# Patient Record
Sex: Male | Born: 1970 | Race: White | Hispanic: No | Marital: Married | Smoking: Former smoker
Health system: Southern US, Community
[De-identification: ages and names within clinical notes are randomized; demographics above are authoritative.]

## PROBLEM LIST (undated history)

## (undated) ENCOUNTER — Emergency Department (HOSPITAL_COMMUNITY): Discharge status: Against Medical Advice | Payer: Self-pay

## (undated) DIAGNOSIS — F419 Anxiety disorder, unspecified: Secondary | ICD-10-CM

## (undated) DIAGNOSIS — G4700 Insomnia, unspecified: Secondary | ICD-10-CM

## (undated) DIAGNOSIS — L4050 Arthropathic psoriasis, unspecified: Secondary | ICD-10-CM

## (undated) DIAGNOSIS — K219 Gastro-esophageal reflux disease without esophagitis: Secondary | ICD-10-CM

## (undated) DIAGNOSIS — F32A Depression, unspecified: Secondary | ICD-10-CM

## (undated) DIAGNOSIS — L409 Psoriasis, unspecified: Secondary | ICD-10-CM

## (undated) HISTORY — PX: BONE MARROW BIOPSY: SHX199

## (undated) HISTORY — DX: Depression, unspecified: F32.A

## (undated) HISTORY — DX: Psoriasis, unspecified: L40.9

## (undated) HISTORY — PX: ANAL FISSURE REPAIR: SHX2312

## (undated) HISTORY — DX: Arthropathic psoriasis, unspecified: L40.50

## (undated) HISTORY — DX: Anxiety disorder, unspecified: F41.9

## (undated) HISTORY — DX: Gastro-esophageal reflux disease without esophagitis: K21.9

## (undated) HISTORY — DX: Insomnia, unspecified: G47.00

## (undated) HISTORY — PX: LYMPH NODE BIOPSY: SHX201

## (undated) HISTORY — PX: CATARACT EXTRACTION, BILATERAL: SHX1313

## (undated) HISTORY — PX: CYST EXCISION: SHX5701

---

## 2003-01-16 ENCOUNTER — Emergency Department (HOSPITAL_COMMUNITY): Admission: EM | Admit: 2003-01-16 | Discharge: 2003-01-16 | Payer: Self-pay | Admitting: Emergency Medicine

## 2003-01-28 ENCOUNTER — Ambulatory Visit (HOSPITAL_COMMUNITY): Admission: RE | Admit: 2003-01-28 | Discharge: 2003-01-28 | Payer: Self-pay | Admitting: Hematology & Oncology

## 2003-01-29 ENCOUNTER — Ambulatory Visit (HOSPITAL_COMMUNITY): Admission: RE | Admit: 2003-01-29 | Discharge: 2003-01-29 | Payer: Self-pay | Admitting: Hematology & Oncology

## 2003-02-13 ENCOUNTER — Encounter: Admission: RE | Admit: 2003-02-13 | Discharge: 2003-02-13 | Payer: Self-pay | Admitting: Infectious Diseases

## 2003-03-12 ENCOUNTER — Encounter: Admission: RE | Admit: 2003-03-12 | Discharge: 2003-03-12 | Payer: Self-pay | Admitting: Infectious Diseases

## 2003-03-25 ENCOUNTER — Ambulatory Visit (HOSPITAL_COMMUNITY): Admission: RE | Admit: 2003-03-25 | Discharge: 2003-03-25 | Payer: Self-pay

## 2004-07-21 ENCOUNTER — Ambulatory Visit: Payer: Self-pay | Admitting: Family Medicine

## 2004-07-21 ENCOUNTER — Ambulatory Visit: Payer: Self-pay | Admitting: Hematology & Oncology

## 2004-07-26 ENCOUNTER — Ambulatory Visit (HOSPITAL_COMMUNITY): Admission: RE | Admit: 2004-07-26 | Discharge: 2004-07-26 | Payer: Self-pay | Admitting: Hematology & Oncology

## 2004-09-21 ENCOUNTER — Ambulatory Visit: Payer: Self-pay | Admitting: Hematology & Oncology

## 2004-11-09 ENCOUNTER — Ambulatory Visit: Payer: Self-pay | Admitting: Hematology & Oncology

## 2005-01-04 ENCOUNTER — Ambulatory Visit: Payer: Self-pay | Admitting: Hematology & Oncology

## 2005-04-06 ENCOUNTER — Ambulatory Visit: Payer: Self-pay | Admitting: Hematology & Oncology

## 2005-07-14 ENCOUNTER — Ambulatory Visit: Payer: Self-pay | Admitting: Hematology & Oncology

## 2005-07-18 LAB — CBC & DIFF AND RETIC
BASO%: 0.3 % (ref 0.0–2.0)
Basophils Absolute: 0 10*3/uL (ref 0.0–0.1)
EOS%: 1.3 % (ref 0.0–7.0)
Eosinophils Absolute: 0 10*3/uL (ref 0.0–0.5)
HCT: 36.3 % — ABNORMAL LOW (ref 38.7–49.9)
HGB: 12.2 g/dL — ABNORMAL LOW (ref 13.0–17.1)
IRF: 0.24 (ref 0.070–0.380)
LYMPH%: 22.2 % (ref 14.0–48.0)
MCH: 28.3 pg (ref 28.0–33.4)
MCHC: 33.7 g/dL (ref 32.0–35.9)
MCV: 84.1 fL (ref 81.6–98.0)
MONO#: 0.2 10*3/uL (ref 0.1–0.9)
MONO%: 7.6 % (ref 0.0–13.0)
NEUT#: 2.1 10*3/uL (ref 1.5–6.5)
NEUT%: 68.6 % (ref 40.0–75.0)
Platelets: 176 10*3/uL (ref 145–400)
RBC: 4.31 10*6/uL (ref 4.20–5.71)
RDW: 15.2 % — ABNORMAL HIGH (ref 11.2–14.6)
RETIC #: 38.4 10*3/uL (ref 31.8–103.9)
Retic %: 0.9 % (ref 0.7–2.3)
WBC: 3 10*3/uL — ABNORMAL LOW (ref 4.0–10.0)
lymph#: 0.7 10*3/uL — ABNORMAL LOW (ref 0.9–3.3)

## 2005-07-18 LAB — CHCC SMEAR

## 2005-11-11 ENCOUNTER — Ambulatory Visit: Payer: Self-pay | Admitting: Hematology & Oncology

## 2006-06-20 IMAGING — CT CT ABDOMEN W/ CM
1 of 4 series · 13 of 32 positions shown, 18 images · IV contrast (omnipaque)
Comparison: 01/29/03.
CHEST:

CLINICAL DATA: Anemia, adenopathy.
CT SCAN OF THE CHEST, ABDOMEN, AND PELVIS WITH CONTRAST ? 07/26/04 AT 6566 HOURS:
TECHNIQUE: 150 cc of Omnipaque 300.

[Series 2: cap 5.0 b40s st · axial · 0.79mm/px · z∈[+806,+1406]mm · 13 of 138 slices shown, 18 images]
[im 9/138  soft-tissue]
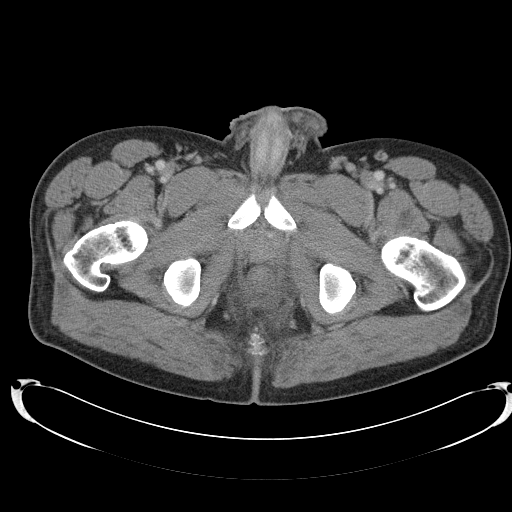
[im 9/138  bone]
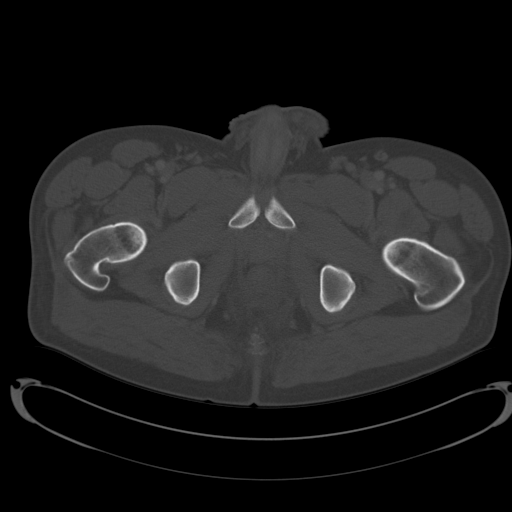
[im 25/138  soft-tissue]
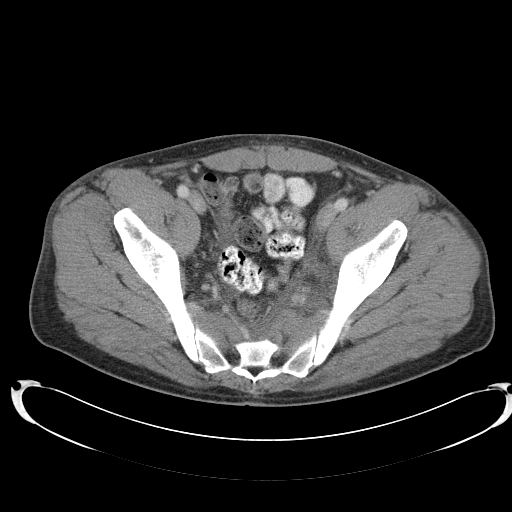
[im 33/138  soft-tissue]
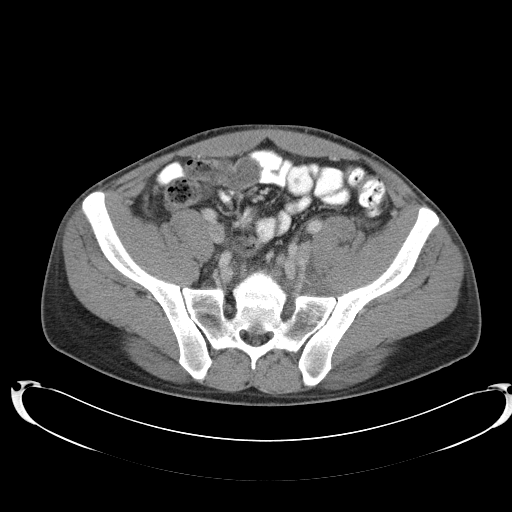
[im 41/138  soft-tissue]
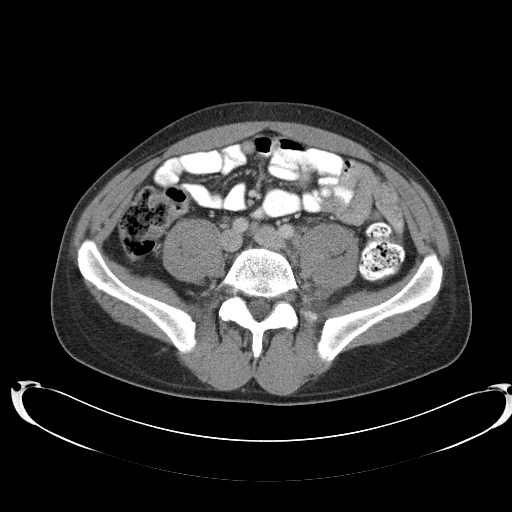
[im 57/138  soft-tissue]
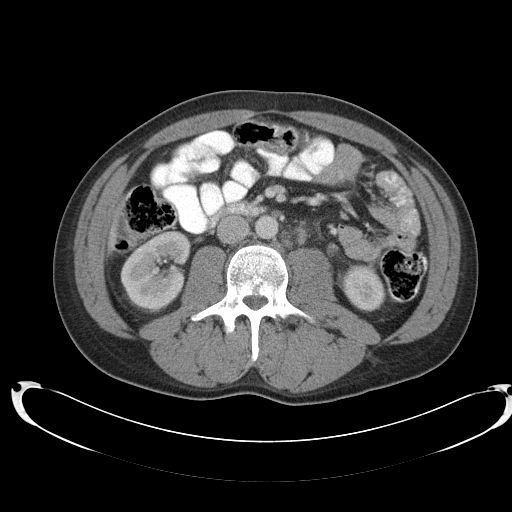
[im 65/138  soft-tissue]
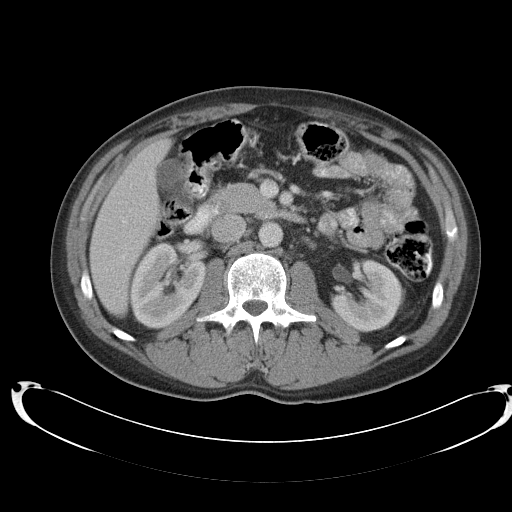
[im 73/138  soft-tissue]
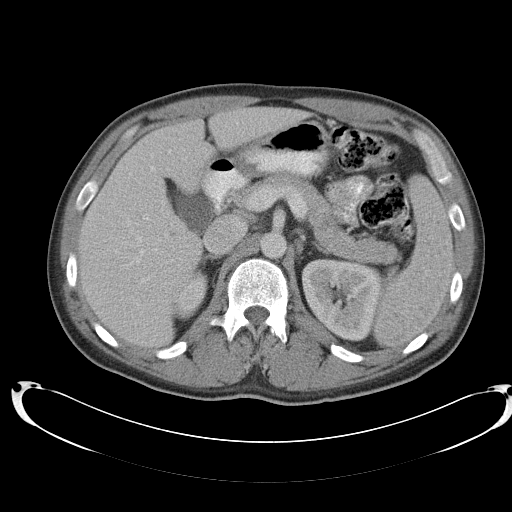
[im 89/138  soft-tissue]
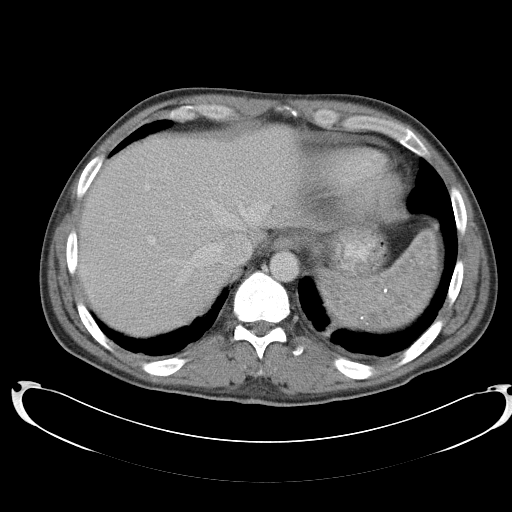
[im 97/138  soft-tissue]
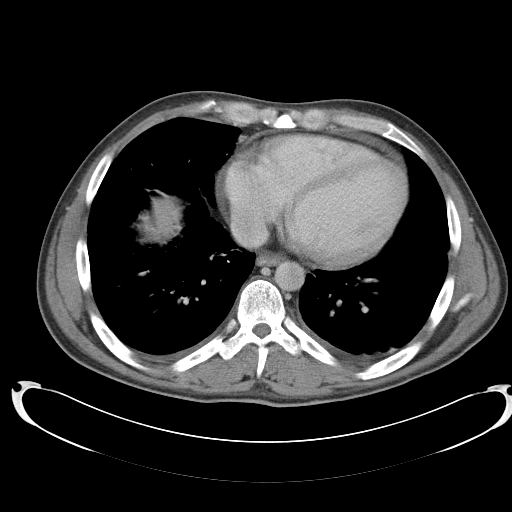
[im 97/138  bone]
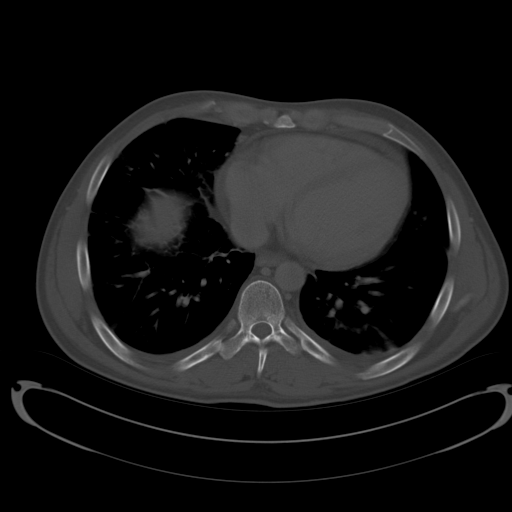
[im 105/138  soft-tissue]
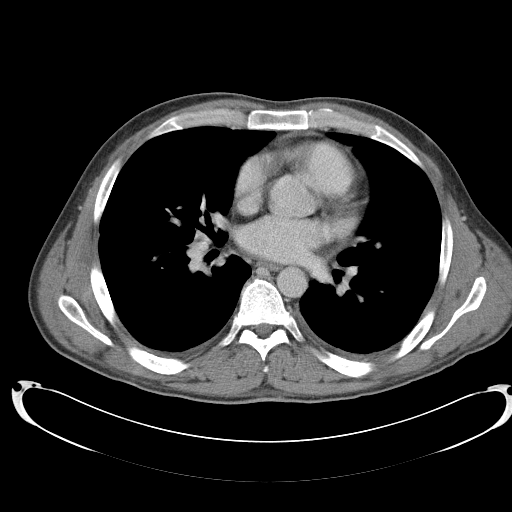
[im 105/138  lung]
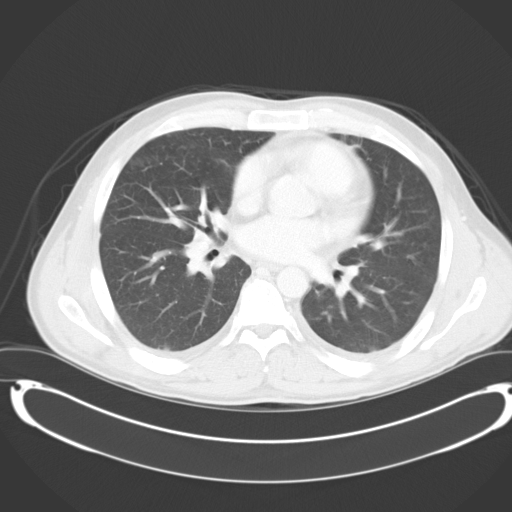
[im 113/138  lung]
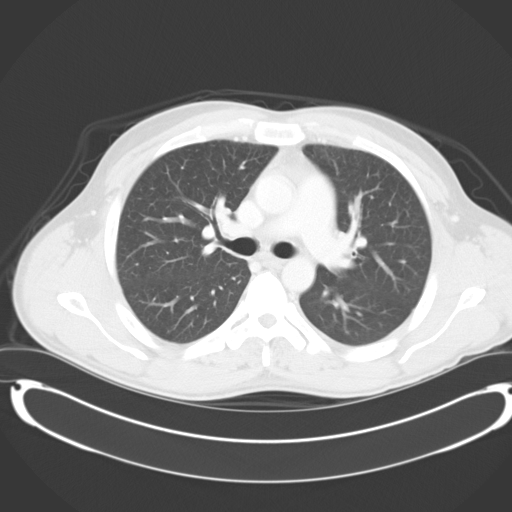
[im 121/138  soft-tissue]
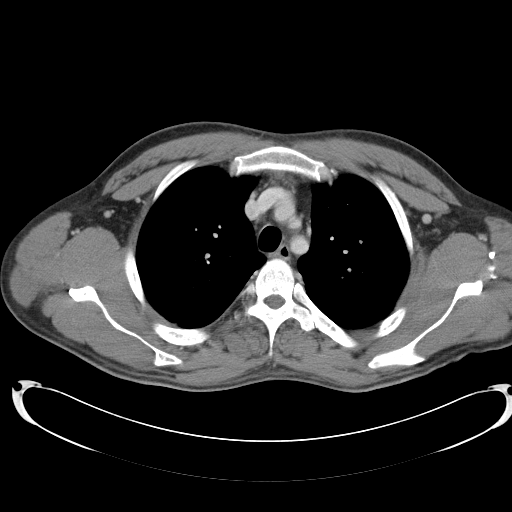
[im 121/138  lung]
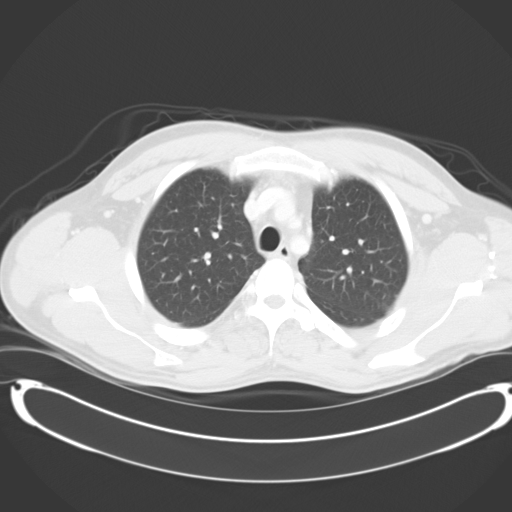
[im 129/138  soft-tissue]
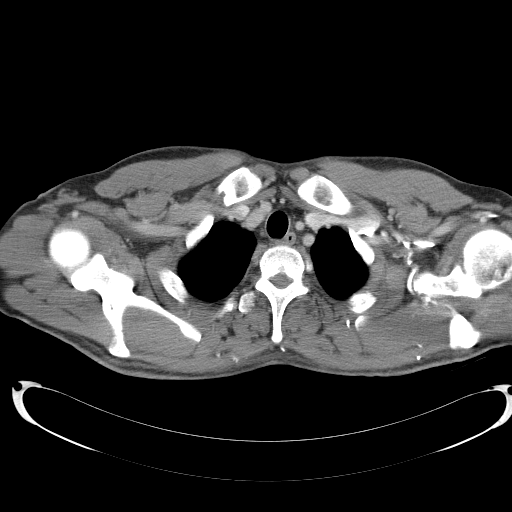
[im 129/138  lung]
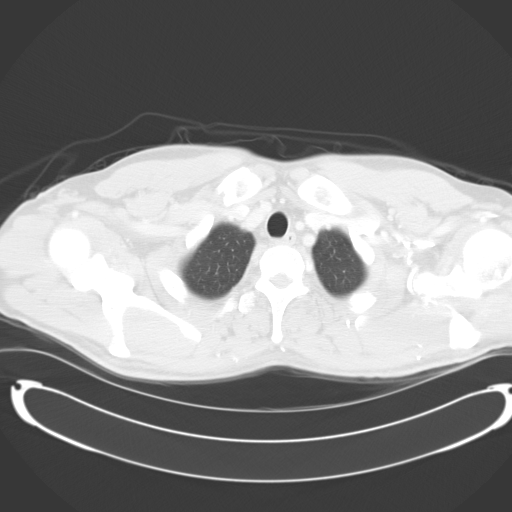

[13 of 32 positions shown; findings below may reference images not displayed]

FINDINGS: Small hilar and mediastinal nodes are seen.  Negative abnormal adenopathy by CT measurements criteria.  A tiny pericardial effusion is noted.  Tiny bilateral pleural effusions are present.  Nodular pleural thickening no longer appreciated.  There are however, multifocal areas of patchy atelectasis throughout the periphery of both lungs as well as the lung bases.  No focal masses are seen.
IMPRESSION: 1.  Tiny pericardial effusion and tiny bilateral pleural effusions.
2.  Mild mediastinal adenopathy and pleural nodules are no longer appreciated.
3.  Bilateral patchy atelectasis.
ABDOMEN:
FINDINGS: Multifocal low density lesions throughout the spleen with calcifications are again seen.  Adenopathy in the splenic hilum has markedly improved.  Persistently enlarged nodes are still noted.  On image #64, there is a 1.2 cm short-axis diameter lymph node.  Other smaller nodes are seen.  
Para-aortic adenopathy is mild and unchanged.  Small mesenteric nodes are less prominent.  
The liver, gallbladder, and adrenal glands are within normal limits.  Renal calculi are again noted.  The pancreas is within normal limits without focal mass effect.  Stranding in the retroperitoneum at the region of the left para-aortic adenopathy is present compatible with infiltration of the fat.  It is unchanged.
IMPRESSION: 1.  Adenopathy in the splenic hilum has markedly improved.  Mesenteric adenopathy is better.  Retroperitoneal adenopathy is not significantly changed.  This supports a benign etiology such as an inflammatory process.
2.  Splenic lesions with calcifications would support a granulomatous etiology such as fungal infection.
PELVIS:
FINDINGS: Stranding in the retroperitoneum involving the left side of the pelvis and perirectal region is more prominent.  The patient does have a history of recent anal fistula surgery.  Small retroperitoneal nodes are seen.  No suspicious bone lesions are present.  The bladder is within normal limits.  There is a 2.6 x 1.7 cm low density lesion in the left iliopsoas muscle on image #127 that is not significantly changed.  There is no focal abscess cavity.  There is no linear soft tissue density extending from the rectum through the ischiorectal fossa to suggest persistent fistula.
IMPRESSION: 1.  Stranding about the perirectal fat and left side of the pelvis is worse however, the patient has had recent surgery in the anal region. Consider postoperative changes and/or inflammation.
2.  Low density lesion in the left iliopsoas muscle not changed over the course of eighteen months.  This would support benign etiology.

## 2016-07-20 ENCOUNTER — Encounter: Payer: Self-pay | Admitting: Sports Medicine

## 2016-07-20 ENCOUNTER — Ambulatory Visit (INDEPENDENT_AMBULATORY_CARE_PROVIDER_SITE_OTHER): Payer: BLUE CROSS/BLUE SHIELD | Admitting: Sports Medicine

## 2016-07-20 DIAGNOSIS — B351 Tinea unguium: Secondary | ICD-10-CM

## 2016-07-20 DIAGNOSIS — L600 Ingrowing nail: Secondary | ICD-10-CM | POA: Diagnosis not present

## 2016-07-20 DIAGNOSIS — M79675 Pain in left toe(s): Secondary | ICD-10-CM | POA: Diagnosis not present

## 2016-07-20 NOTE — Progress Notes (Signed)
Patient ID: Aaron Fowler, male   DOB: 04/17/1970, 46 y.o.   MRN: [id number]

## 2016-07-20 NOTE — Patient Instructions (Signed)

## 2016-07-20 NOTE — Progress Notes (Signed)
Subjective: Aaron Fowler is a 46 y.o. male patient presents to office today complaining of a painful incurvated, red, hot, swollen left 1st toenail at lateral margin. This has been present for sometime now is on Keflex as given by pcp. Patient has treated this by self trimming. Patient denies fever/chills/nausea/vomitting/any other related constitutional symptoms at this time.  There are no active problems to display for this patient.   No current outpatient prescriptions on file prior to visit.   No current facility-administered medications on file prior to visit.     Allergies  Allergen Reactions  . Sulfamethoxazole Rash    Other reaction(s): Fever (intolerance)    Objective:  There were no vitals filed for this visit.  General: Well developed, nourished, in no acute distress, alert and oriented x3   Dermatology: Skin is warm, dry and supple bilateral. Left hallux nail appears to be  severely incurvated with hyperkeratosis formation at the distal aspects of  the lateral nail border. (-) Erythema. (+) Edema. (-) serosanguous  drainage present. The nail is mycotic as well. The remaining nails appear unremarkable at this time. There are psoratic skin changes bilateral.There are no open sores, lesions or other signs of infection  present.  Vascular: Dorsalis Pedis artery and Posterior Tibial artery pedal pulses are 2/4 bilateral with immedate capillary fill time. Pedal hair growth present. No lower extremity edema.   Neruologic: Grossly intact via light touch bilateral.  Musculoskeletal: Tenderness to palpation of the left hallux lateral>medial nail fold(s). Muscular strength within normal limits in all groups bilateral.   Assesement and Plan: Problem List Items Addressed This Visit    None    Visit Diagnoses    Ingrown nail    -  Primary   Dermatophytosis of nail       Toe pain, left          -Discussed treatment alternatives and plan of care; Explained  permanent/temporary nail avulsion and post procedure course to patient. Patient opt for total permanent removal of left hallux nail  - After a verbal consent, injected 3 ml of a 50:50 mixture of 2% plain  lidocaine and 0.5% plain marcaine in a normal hallux block fashion. Next, a  betadine prep was performed. Anesthesia was tested and found to be appropriate.  The left hallux nail was then incised from the hyponychium to the epinychium. The offending nail border was removed and cleared from the field. The area was curretted for any remaining nail or spicules. Phenol application performed and the area was then flushed with alcohol and dressed with antibiotic cream and a dry sterile dressing. -Patient was instructed to leave the dressing intact for today and begin soaking  in a weak solution of betadine or Epsom salt and water tomorrow. Patient was instructed to  soak for 15 minutes each day and apply neosporin and a gauze or bandaid dressing each day. -Patient was instructed to monitor the toe for signs of infection and return to office if toe becomes red, hot or swollen. -Advised ice, elevation, and tylenol or motrin if needed for pain.  -Work note given to return Monday 07-25-16 -Patient is to return in 2 weeks for follow up care/nail check or sooner if problems arise.  Asencion Islamitorya Kysha Muralles, DPM

## 2016-07-20 NOTE — Progress Notes (Signed)
   Subjective:    Patient ID: Aaron MooreJames D Mims, male    DOB: 12/06/1970, 46 y.o.   MRN: [id number]  HPI   I have an ingrown on my left big toe and has been going on for about 2 years and does hurt to touch and went to the doctor last week and gave an antibiotic and I have dug on it and does get sore and tender and I am not a diabetic    Review of Systems  All other systems reviewed and are negative.      Objective:   Physical Exam        Assessment & Plan:

## 2016-08-04 ENCOUNTER — Ambulatory Visit (INDEPENDENT_AMBULATORY_CARE_PROVIDER_SITE_OTHER): Payer: BLUE CROSS/BLUE SHIELD | Admitting: Sports Medicine

## 2016-08-04 DIAGNOSIS — Z9889 Other specified postprocedural states: Secondary | ICD-10-CM

## 2016-08-04 DIAGNOSIS — M79675 Pain in left toe(s): Secondary | ICD-10-CM

## 2016-08-04 NOTE — Progress Notes (Signed)
Subjective: Aaron Fowler is a 46 y.o. male patient returns to office today for follow up evaluation after having Left Hallux total permanent nail avulsion performed on 07-20-16. Patient has been soaking using epsom salt and applying topical antibiotic covered with bandaid daily. Patient deniesfever/chills/nausea/vomitting/any other related constitutional symptoms at this time.  There are no active problems to display for this patient.   Current Outpatient Prescriptions on File Prior to Visit  Medication Sig Dispense Refill  . predniSONE (DELTASONE) 20 MG tablet Take 15 mg by mouth.    . zolpidem (AMBIEN) 10 MG tablet Take 10 mg by mouth.     No current facility-administered medications on file prior to visit.     Allergies  Allergen Reactions  . Sulfamethoxazole Rash    Other reaction(s): Fever (intolerance)    Objective:  General: Well developed, nourished, in no acute distress, alert and oriented x3   Dermatology: Skin is warm, dry and supple bilateral. Left hallux nail bed appears to be clean, dry, with mild granular tissue and surrounding eschar/scab. (-) Erythema. (-) Edema. (-) serosanguous drainage present. The remaining nails appear unremarkable at this time. There are no other lesions or other signs of infection  present.  Neurovascular status: Intact. No lower extremity swelling; No pain with calf compression bilateral.  Musculoskeletal: Decreased tenderness to palpation of the Left hallux. Muscular strength within normal limits bilateral.   Assesement and Plan: Problem List Items Addressed This Visit    None    Visit Diagnoses    S/P nail surgery    -  Primary   Toe pain, left          -Examined patient  -Cleansed left hallux nail bed and gently scrubbed with peroxide and q-tip/curetted away eschar at site and applied antibiotic cream covered with bandaid.  -Discussed plan of care with patient. -Patient to continue soaking in a weak solution of Epsom salt and  warm water. Patient was instructed to soak for 15-20 minutes each day until the toe appears normal and there is no drainage, redness, tenderness, or swelling at the procedure site, and apply neosporin and a gauze or bandaid dressing each day as needed. May leave open to air at night. -Educated patient on long term care after nail surgery. -Patient was instructed to monitor the toe for reoccurrence and signs of infection; Patient advised to return to office or go to ER if toe becomes red, hot or swollen. -Patient is to return as needed or sooner if problems arise.  Aaron Fowler, DPM

## 2016-10-04 DIAGNOSIS — N179 Acute kidney failure, unspecified: Secondary | ICD-10-CM | POA: Diagnosis not present

## 2016-10-04 DIAGNOSIS — R109 Unspecified abdominal pain: Secondary | ICD-10-CM | POA: Diagnosis not present

## 2016-10-05 DIAGNOSIS — R109 Unspecified abdominal pain: Secondary | ICD-10-CM | POA: Diagnosis not present

## 2016-10-05 DIAGNOSIS — N179 Acute kidney failure, unspecified: Secondary | ICD-10-CM | POA: Diagnosis not present

## 2016-10-06 DIAGNOSIS — N179 Acute kidney failure, unspecified: Secondary | ICD-10-CM | POA: Diagnosis not present

## 2016-10-06 DIAGNOSIS — R109 Unspecified abdominal pain: Secondary | ICD-10-CM | POA: Diagnosis not present

## 2016-10-06 LAB — HM COLONOSCOPY

## 2016-10-07 DIAGNOSIS — N179 Acute kidney failure, unspecified: Secondary | ICD-10-CM | POA: Diagnosis not present

## 2016-10-07 DIAGNOSIS — R109 Unspecified abdominal pain: Secondary | ICD-10-CM | POA: Diagnosis not present

## 2019-12-23 NOTE — Progress Notes (Signed)
Office Visit Note  Patient: Aaron Fowler             Date of Birth: February 08, 1971           MRN: [id number]             PCP: Charlynn Court, NP Referring: Charlynn Court, NP Visit Date: 12/24/2019   Subjective:   History of Present Illness: Aaron Fowler is a 49 y.o. male referred here for skin rash and joint pains.  He has a history of psoriasis since teenage years and has been treated with a variety of medications including topical treatments, glucocorticoids, methotrexate, and TNF inhibitors.  He took methotrexate for about 5 years and some improvement was reported in skin rash and joint pains.  Records reviewed state concerned about liver unclear if there was actual laboratory/LFTs changes or not.  Treated with Enbrel x1 year discontinued after case of fungal stomatitis.  He was treated with Humira 2001-2012 reported little benefit for skin and joint.  Work-up with cancer center here and 2004-2005 due to lymphadenopathy and splenomegaly that was nonrevealing for any malignancy.  He has been treated with daily glucocorticoids for many years and for the past 10 years was on about 15 mg daily dose.  Due to his extensive weight gain, edema, and skin bruising he wanted to taper this so went down from 15 mg to 0 over the past 6 months at 2.5 mg/month increments.  Since coming off completely about a month or 2 ago he is experiencing increased diffuse myalgias and arthralgias, lower extremity muscle weakness, fatigue, and episodes of dizziness.  He has been successful losing about 60 pounds during this time.  He started taking diclofenac for the joint pain with improvement.  He recently had surgery for bilateral cataracts.  He denies any significant recent infections in the past year.    Activities of Daily Living:  Patient reports morning stiffness for all  Day. Patient Reports nocturnal pain.  Difficulty dressing/grooming: Reports Difficulty climbing stairs: Reports Difficulty getting out of  chair: Reports Difficulty using hands for taps, buttons, cutlery, and/or writing: Denies  Review of Systems  Constitutional: Positive for fatigue.  HENT: Positive for mouth sores and nose dryness. Negative for mouth dryness.   Eyes: Positive for pain, itching and dryness.  Respiratory: Positive for shortness of breath and difficulty breathing.   Cardiovascular: Negative for chest pain and palpitations.  Gastrointestinal: Positive for diarrhea. Negative for blood in stool and constipation.  Endocrine: Positive for increased urination.  Genitourinary: Positive for urgency. Negative for difficulty urinating.  Musculoskeletal: Positive for arthralgias, joint pain, joint swelling, myalgias, morning stiffness, muscle tenderness and myalgias.  Skin: Positive for rash. Negative for color change.  Allergic/Immunologic: Negative for susceptible to infections.  Neurological: Positive for dizziness, numbness, headaches and weakness. Negative for memory loss.  Hematological: Positive for bruising/bleeding tendency.  Psychiatric/Behavioral: Negative for confusion.    PMFS History:  Patient Active Problem List   Diagnosis Date Noted  . Other psoriasis 12/24/2019  . Long term current use of systemic steroids 12/24/2019  . Polyarthralgia 12/24/2019    Past Medical History:  Diagnosis Date  . Psoriasis    per patient     Family History  Problem Relation Age of Onset  . Heart Problems Mother   . Prostate cancer Father   . Autoimmune disease Sister    Past Surgical History:  Procedure Laterality Date  . ANAL FISSURE REPAIR    . BONE MARROW  BIOPSY    . CATARACT EXTRACTION, BILATERAL     10/28/2019, 12/16/2019   . CYST EXCISION     on buttock   . LYMPH NODE BIOPSY     Social History   Social History Narrative  . Not on file    There is no immunization history on file for this patient.   Objective: Vital Signs: BP 99/66 (BP Location: Right Arm, Patient Position: Sitting, Cuff Size:  Normal)   Pulse 86   Resp 15   Ht $R'6\' 1"'gU$  (1.854 m)   Wt 196 lb 3.2 oz (89 kg)   BMI 25.89 kg/m    Physical Exam HENT:     Right Ear: External ear normal.     Left Ear: External ear normal.     Mouth/Throat:     Mouth: Mucous membranes are moist.     Pharynx: Oropharynx is clear.     Comments: Full upper and lower dentures Eyes:     Conjunctiva/sclera: Conjunctivae normal.     Pupils: Pupils are equal, round, and reactive to light.  Cardiovascular:     Rate and Rhythm: Normal rate and regular rhythm.  Pulmonary:     Effort: Pulmonary effort is normal.     Breath sounds: Normal breath sounds.  Skin:    Comments: Scattered erythematous plaques are present on bilateral elbows, palms of hands, right knee Abdominal striae present Skin thinning and dryness present noticeable on hands and feet Probable dermatophytosis of nails and previous we removed left great toenail partially regrown  Neurological:     General: No focal deficit present.      Musculoskeletal Exam:  Neck full range of motion no tenderness Left shoulder pain with abduction and extension no swelling or changes seen, right shoulder normal range of motion  Elbow, wrist, fingers full range of motion mild tenderness but no swelling or erythema Knees full range of motion no tenderness or swelling Bilateral ankles with some soft tissue swelling no clear effusion, no swelling or nodules of Achilles tendon though mildly tender to palpation  CDAI Exam: CDAI Score: -- Patient Global: --; Provider Global: -- Swollen: --; Tender: -- Joint Exam 12/24/2019   No joint exam has been documented for this visit   There is currently no information documented on the homunculus. Go to the Rheumatology activity and complete the homunculus joint exam.  Investigation: No additional findings.  Imaging: No results found.  Recent Labs: Lab Results  Component Value Date   WBC 3.0 (L) 07/18/2005   HGB 12.2 (L) 07/18/2005   PLT  176 07/18/2005    Speciality Comments: No specialty comments available.  Procedures:  No procedures performed Allergies: Sulfamethoxazole   Assessment / Plan:     Visit Diagnoses: Other psoriasis  Skin rash is currently only mildly active with mostly less than 1 cm scattered lesions.  It does appear to be a plaque type lesions over both elbows and the right knee.  Not clear if the small lesions on his palms are related to the removal of steroid therapy versus other active psoriatic disease.  For now expect were needing to restart glucocorticoids for his adrenal insufficiency and will then need to decide if steroid sparing DMARDs are also needed.  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  He reports at least 10 years of 15 mg or greater daily dose.  He was doing quite well while tapering off steroids but has developed numerous additional symptoms after coming off completely which would be consistent with now being below a replacement dose.  He reports diffuse pain, muscle weakness without inflammation, episodes of orthostatic dizziness, as well as fatigue and extremely low energy.  We will go ahead and check inflammatory markers as well as a cortisol and ACTH level not ideal but 9 AM labs while off therapy may be helpful in characterizing this.  Recommend he restart at 5 mg daily dose and follow-up in 2 weeks to see if there is improvement in symptoms.  Polyarthralgia  Having diffuse joint pains currently no obvious synovitis or joint effusions.  There is soft tissue swelling over the ankles but no achilles enthesitis visible on ultrasound.  This could be from active arthritis or could be from glucocorticoid withdrawal we will check inflammatory markers.  Both causes likely to be improved by restarting low-dose  prednisone.  Orders: Orders Placed This Encounter  Procedures  . ACTH  . Cortisol  . Sedimentation rate  . C-reactive protein   Meds ordered this encounter  Medications  . predniSONE (DELTASONE) 2.5 MG tablet    Sig: Take 2 tablets (5 mg total) by mouth daily with breakfast.    Follow-Up Instructions: Return in about 2 weeks (around 01/07/2020).   Collier Salina, MD  Note - This record has been created using [employer].  Chart creation errors have been sought, but may not always  have been located. Such creation errors do not reflect on  the standard of medical care.

## 2019-12-24 ENCOUNTER — Ambulatory Visit (INDEPENDENT_AMBULATORY_CARE_PROVIDER_SITE_OTHER): Payer: BC Managed Care – PPO | Admitting: Internal Medicine

## 2019-12-24 ENCOUNTER — Other Ambulatory Visit: Payer: Self-pay

## 2019-12-24 ENCOUNTER — Encounter: Payer: Self-pay | Admitting: Internal Medicine

## 2019-12-24 DIAGNOSIS — Z7952 Long term (current) use of systemic steroids: Secondary | ICD-10-CM | POA: Diagnosis not present

## 2019-12-24 DIAGNOSIS — L408 Other psoriasis: Secondary | ICD-10-CM

## 2019-12-24 DIAGNOSIS — M2550 Pain in unspecified joint: Secondary | ICD-10-CM

## 2019-12-24 HISTORY — DX: Pain in unspecified joint: M25.50

## 2019-12-24 HISTORY — DX: Long term (current) use of systemic steroids: Z79.52

## 2019-12-24 HISTORY — DX: Other psoriasis: L40.8

## 2019-12-24 MED ORDER — PREDNISONE 2.5 MG PO TABS: 5.0000 mg | ORAL_TABLET | Freq: Every day | ORAL | Status: DC

## 2019-12-26 LAB — SEDIMENTATION RATE: Sed Rate: 19 mm/h — ABNORMAL HIGH (ref 0–15)

## 2019-12-26 LAB — CORTISOL: Cortisol, Plasma: 2.4 ug/dL — ABNORMAL LOW

## 2019-12-26 LAB — C-REACTIVE PROTEIN: CRP: 12.7 mg/L — ABNORMAL HIGH (ref <8.0)

## 2019-12-26 LAB — ACTH: C206 ACTH: 21 pg/mL (ref 6–50)

## 2019-12-30 NOTE — Progress Notes (Signed)
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 He also has increased inflammation markers that could be from his psoriasis. Recommend continuing prednisone for now and we can reassess at follow up.

## 2020-01-06 NOTE — Progress Notes (Signed)
Office Visit Note  Patient: Aaron Fowler             Date of Birth: 05/15/1970           MRN: [id number]             PCP: Charlynn Court, NP Referring: Charlynn Court, NP Visit Date: 01/07/2020  Subjective:   History of Present Illness: Aaron Fowler is a 49 y.o. male with a very long history of repeated evaluations for psoriasis but also felt to have other poorly characterized autoimmune disorder and and joint inflammation with a long history of glucocorticoid treatment who recently restarted prednisone at 45m daily for suspected adrenal insufficiency based on symptoms of orthostatic dizziness, hypotension, diffuse arthralgias, severe fatigue.  Since resuming prednisone he has not noticed a very significant benefit in symptoms.  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  He has not noticed any particular change in skin rashes.   12/24/19 HPI reviewed He has a history of psoriasis since teenage years and has been treated with a variety of medications including topical treatments, glucocorticoids, methotrexate, and TNF inhibitors.  He took methotrexate for about 5 years and some improvement was reported in skin rash and joint pains.  Records reviewed state concerned about liver unclear if there was actual laboratory/LFTs changes or not.  Treated with Enbrel x1 year discontinued after case of fungal stomatitis.  He was treated with Humira 2001-2012 reported little benefit for skin and joint.  Work-up with cancer center here and 2004-2005 due to lymphadenopathy and splenomegaly that was nonrevealing for any malignancy.  He has been treated with daily glucocorticoids for many years and for the past 10 years was on about 15 mg daily dose.  Due to his extensive weight gain, edema, and skin bruising he wanted to taper this so went down from 15 mg to 0 over the past 6 months at 2.5  mg/month increments.  Since coming off completely about a month or 2 ago he is experiencing increased diffuse myalgias and arthralgias, lower extremity muscle weakness, fatigue, and episodes of dizziness.   Activities of Daily Living:  Patient reports morning stiffness for 30 minutes.   Patient Reports nocturnal pain.  Difficulty dressing/grooming: Denies Difficulty climbing stairs: Denies Difficulty getting out of chair: Reports Difficulty using hands for taps, buttons, cutlery, and/or writing: Denies  Review of Systems  Constitutional: Negative for fatigue.  HENT: Negative for mouth sores, mouth dryness and nose dryness.   Eyes: Positive for pain and dryness. Negative for itching and visual disturbance.  Respiratory: Negative for cough, hemoptysis, shortness of breath and difficulty breathing.   Cardiovascular: Negative for chest pain, palpitations and swelling in legs/feet.  Gastrointestinal: Negative for abdominal pain, blood in stool, constipation and diarrhea.  Endocrine: Negative for increased urination.  Genitourinary: Negative for painful urination.  Musculoskeletal: Positive for arthralgias, joint pain, joint swelling, myalgias, muscle weakness, morning stiffness, muscle tenderness and myalgias.  Skin: Negative for color change, rash and redness.  Allergic/Immunologic: Negative for susceptible to infections.  Neurological: Positive for dizziness, numbness and headaches. Negative for memory loss and weakness.  Hematological: Negative for swollen glands.  Psychiatric/Behavioral: Negative for confusion and sleep disturbance.    PMFS History:  Patient Active Problem List   Diagnosis Date Noted   Other psoriasis 12/24/2019   Long term current use of systemic steroids 12/24/2019   Polyarthralgia  12/24/2019    Past Medical History:  Diagnosis Date   Psoriasis    per patient     Family History  Problem Relation Age of Onset   Heart Problems Mother    Prostate cancer  Father    Autoimmune disease Sister    Past Surgical History:  Procedure Laterality Date   ANAL FISSURE REPAIR     BONE MARROW BIOPSY     CATARACT EXTRACTION, BILATERAL     10/28/2019, 12/16/2019    CYST EXCISION     on buttock    LYMPH NODE BIOPSY     Social History   Social History Narrative   Not on file    There is no immunization history on file for this patient.   Objective: Vital Signs: BP (!) 88/53 (BP Location: Right Arm, Patient Position: Sitting, Cuff Size: Small)    Pulse 77    Ht 6' 1"  (1.854 m)    Wt 188 lb (85.3 kg)    BMI 24.80 kg/m    Physical Exam HENT:     Right Ear: External ear normal.     Left Ear: External ear normal.  Skin:    General: Skin is warm and dry.     Comments: There are few scab over erythematous spots on the dorsum of the right knuckles and palms of both hands Skin thinning and dryness present on extremities Skin on his trunk and proximal arms with diffuse piloerection/gooseflesh no erythematous rashes Abdominal striae present  Neurological:     Mental Status: He is alert.     Musculoskeletal Exam:  Elbow, wrist, fingers full range of motion no tenderness or swelling Probable soft tissue swelling over lateral ankles bilaterally without erythema or warmth, ROM preserved  CDAI Exam: CDAI Score: -- Patient Global: --; Provider Global: -- Swollen: --; Tender: -- Joint Exam 01/07/2020   No joint exam has been documented for this visit   There is currently no information documented on the homunculus. Go to the Rheumatology activity and complete the homunculus joint exam.  Investigation: No additional findings.  Imaging: No results found.  Recent Labs: Lab Results  Component Value Date   WBC 3.0 (L) 07/18/2005   HGB 12.2 (L) 07/18/2005   PLT 176 07/18/2005    Speciality Comments: No specialty comments available.  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  This is not typically a finding of inflammatory joint disease.  He has not had very significant improvement on low-dose prednisone is possible that the replacement dose is still too low.  There is some bilateral ankle swelling though not with change range of motion or warmth unclear if this represents enthesitis or arthritis.  I discussed did not see a very clear indication for specific steroid sparing DMARD treatment at this time so recommended increasing prednisone till he gets a clinical response initially. This would be expected to help symptoms whether related to an inflammatory arthritis or from adrenal insufficiency. He was frustrated with this recommendation as he has had many years of minimal symptom benefit despite using steroids without a clear diagnosis or more specific plan despite seeing multiple specialists [place] for [place]. He does plan to try increasing steroid dose back to 10 mg for his symptoms but says he will follow-up for this medication with  his primary doctors.  Long term current use of systemic steroids  Also discussed if symptoms are still related to suspected adrenal insufficiency could be video evaluated by endocrinology he was not interested in referral or a work-up for that at this time.  I do think with any transition to glucocorticoid sparing treatment or just reducing treatment will need to be managed very slowly considering his hypotension and orthostatic symptoms especially.  Other psoriasis  Do not see skin disease activity for psoriasis at this time.  Last visit he had small erythematous plaques on the elbows but his skin complaint now with the diffuse to bumpy skin texture not typical for psoriatic inflammation.  He has been tried previously on TNF inhibitors without great  response and has you steroids extensively.  Without inflammation more clearly attributable to active psoriasis I would not start a trial of alternative biologic DMARD such as IL-17 or IL-23 targeted treatments.  Orders: No orders of the defined types were placed in this encounter.  No orders of the defined types were placed in this encounter.   Follow-Up Instructions: No follow-ups on file.   Collier Salina, MD  Note - This record has been created using [employer].  Chart creation errors have been sought, but may not always  have been located. Such creation errors do not reflect on  the standard of medical care.

## 2020-01-07 ENCOUNTER — Ambulatory Visit: Payer: BC Managed Care – PPO | Admitting: Internal Medicine

## 2020-01-07 ENCOUNTER — Encounter: Payer: Self-pay | Admitting: Internal Medicine

## 2020-01-07 ENCOUNTER — Other Ambulatory Visit: Payer: Self-pay

## 2020-01-07 VITALS — BP 88/53 | HR 77 | Ht 73.0 in | Wt 188.0 lb

## 2020-01-07 DIAGNOSIS — M2550 Pain in unspecified joint: Secondary | ICD-10-CM | POA: Diagnosis not present

## 2020-01-07 DIAGNOSIS — L408 Other psoriasis: Secondary | ICD-10-CM

## 2020-01-07 DIAGNOSIS — Z7952 Long term (current) use of systemic steroids: Secondary | ICD-10-CM | POA: Diagnosis not present

## 2020-01-22 ENCOUNTER — Other Ambulatory Visit: Payer: Self-pay

## 2020-01-22 ENCOUNTER — Ambulatory Visit: Payer: BC Managed Care – PPO | Admitting: Sports Medicine

## 2020-01-22 ENCOUNTER — Encounter: Payer: Self-pay | Admitting: Sports Medicine

## 2020-01-22 ENCOUNTER — Other Ambulatory Visit: Payer: Self-pay | Admitting: *Deleted

## 2020-01-22 DIAGNOSIS — M79674 Pain in right toe(s): Secondary | ICD-10-CM

## 2020-01-22 DIAGNOSIS — L408 Other psoriasis: Secondary | ICD-10-CM | POA: Diagnosis not present

## 2020-01-22 DIAGNOSIS — L603 Nail dystrophy: Secondary | ICD-10-CM | POA: Diagnosis not present

## 2020-01-22 DIAGNOSIS — M2550 Pain in unspecified joint: Secondary | ICD-10-CM | POA: Diagnosis not present

## 2020-01-22 NOTE — Progress Notes (Signed)
Subjective: Aaron Fowler is a 49 y.o. male patient presents to office today complaining of a painful thickened right great toenail.  Patient reports that it hurts in his shoes and would like to discuss removal of this nail.  Patient denies significant redness warmth swelling drainage or any constitutional symptoms at this time.  No other pedal complaints noted.   Patient Active Problem List   Diagnosis Date Noted  . Other psoriasis 12/24/2019  . Long term current use of systemic steroids 12/24/2019  . Polyarthralgia 12/24/2019    Current Outpatient Medications on File Prior to Visit  Medication Sig Dispense Refill  . pantoprazole (PROTONIX) 40 MG tablet Take 40 mg by mouth daily.    . predniSONE (DELTASONE) 2.5 MG tablet Take 2 tablets (5 mg total) by mouth daily with breakfast.    . tiZANidine (ZANAFLEX) 4 MG tablet Take 4 mg by mouth 3 (three) times daily.    Marland Kitchen zolpidem (AMBIEN) 10 MG tablet Take 10 mg by mouth.     No current facility-administered medications on file prior to visit.    Allergies  Allergen Reactions  . Sulfamethoxazole Rash    Other reaction(s): Fever (intolerance)    Objective:  There were no vitals filed for this visit.  General: Well developed, nourished, in no acute distress, alert and oriented x3   Dermatology: Skin is warm, dry and supple bilateral.  Right hallux nail severely thickened and overgrown with subungual debris that is severe in nature. The remaining nails appear unremarkable at this time. There are no open sores, lesions or other signs of infection  present.  Vascular: Dorsalis Pedis artery and Posterior Tibial artery pedal pulses are 2/4 bilateral with immedate capillary fill time. Pedal hair growth present. No lower extremity edema.   Neruologic: Grossly intact via light touch bilateral.  Musculoskeletal: Tenderness to palpation of right hallux nail.  Patient is status post previous P&A procedure performed on the left hallux  nail.  Assesement and Plan: Problem List Items Addressed This Visit      Musculoskeletal and Integument   Other psoriasis     Other   Polyarthralgia    Other Visit Diagnoses    Toe pain, right    -  Primary   Onychodystrophy          -Discussed treatment alternatives and plan of care; Explained permanent/temporary nail avulsion and post procedure course to patient. Patient elects for permanent total nail avulsion right hallux - After a verbal and written consent, injected 3 ml of a 50:50 mixture of 2% plain  lidocaine and 0.5% plain marcaine in a normal hallux block fashion. Next, a  betadine prep was performed. Anesthesia was tested and found to be appropriate.  The offending right hallux nail completely was then incised from the hyponychium to the epinychium. The offending nail border was removed and cleared from the field. The area was curretted for any remaining nail or spicules. Phenol application performed and the area was then flushed with alcohol and dressed with antibiotic cream and a dry sterile dressing. -Patient was instructed to leave the dressing intact for today and begin soaking  in a weak solution of betadine or Epsom salt and water tomorrow. Patient was instructed to  soak for 15-20 minutes each day and apply neosporin/corticosporin and a gauze or bandaid dressing each day. -Patient was instructed to monitor the toe for signs of infection and return to office if toe becomes red, hot or swollen. -Advised ice, elevation, and tylenol or  motrin if needed for pain.  -Patient is to return in 2 to 3 weeks for follow up care/nail check or sooner if problems arise.  Asencion Islam, DPM

## 2020-02-12 ENCOUNTER — Ambulatory Visit: Payer: BC Managed Care – PPO | Admitting: Sports Medicine

## 2021-10-11 DIAGNOSIS — H353131 Nonexudative age-related macular degeneration, bilateral, early dry stage: Secondary | ICD-10-CM | POA: Diagnosis not present

## 2022-02-01 ENCOUNTER — Ambulatory Visit (INDEPENDENT_AMBULATORY_CARE_PROVIDER_SITE_OTHER): Payer: BC Managed Care – PPO

## 2022-02-01 ENCOUNTER — Ambulatory Visit: Payer: BC Managed Care – PPO | Admitting: Podiatry

## 2022-02-01 DIAGNOSIS — M25571 Pain in right ankle and joints of right foot: Secondary | ICD-10-CM

## 2022-02-01 DIAGNOSIS — M79672 Pain in left foot: Secondary | ICD-10-CM

## 2022-02-01 DIAGNOSIS — M79671 Pain in right foot: Secondary | ICD-10-CM

## 2022-02-01 DIAGNOSIS — M7752 Other enthesopathy of left foot: Secondary | ICD-10-CM

## 2022-02-01 NOTE — Progress Notes (Signed)
Subjective:  Patient ID: Aaron Fowler, male    DOB: [birth year],  MRN: [id number]  Chief Complaint  Patient presents with   Foot Pain    Pt states he is having sharp pain in his left heel , he states the pain stay around the heel area Aching Pain in the right heel that goes up to his ankle , pt denies any swelling     51 y.o. male presents with concern for pain in his left heel as well as pain in the right ankle.  He says the heel pain is worse than the right ankle however both are bothering him.  He does have a history of chronic steroid use for inflammatory pain.  He says that he has had this heel pain in the past however usually it goes away on its own but this time this continue to bother him and is getting worse.  He also has pain in his right ankle on the outside.  This is flared up recently as well seems to be getting worse.  Denies any acute injuries.  Does have dry cracked skin on both heels as well and is looking for recommendations for that as well.  Past Medical History:  Diagnosis Date   Psoriasis    per patient     Allergies  Allergen Reactions   Sulfamethoxazole Rash    Other reaction(s): Fever (intolerance)    ROS: Negative except as per HPI above  Objective:  General: AAO x3, NAD  Dermatological: Dry cracked skin with fissuring present on bilateral heel.  Vascular:  Dorsalis Pedis artery and Posterior Tibial artery pedal pulses are 2/4 bilateral.  Capillary fill time < 3 sec to all digits.   Neruologic: Grossly intact via light touch bilateral. Protective threshold intact to all sites bilateral.   Musculoskeletal: Pain with palpation of the plantar aspect of the left heel directly underlying the calcaneus at the site of plantar calcaneal bursa.  No significant pain at the plantar medial tubercle slightly anterior at the site of insertion of plantar fascia.  Plantar right ankle there is noted to be pain at the sinus tarsi with palpation.  Also pain at the course  of the peroneus longus and brevis tendons distally.  Gait: Unassisted, Nonantalgic.   No images are attached to the encounter.  Radiographs:  Date: 01/24/2022 XR both feet Weightbearing AP/Lateral/Oblique   Findings: No acute osseous abnormality identified no fracture identified.  On the left heel there is no significant osseous spurring plantarly or posteriorly.  Arthritic changes noted about the right ankle joint and subtalar joint.  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 Calcaneal bursitis, left   3. Pain in both feet      Plan:  Patient was evaluated and treated and all questions answered.  #Plantar calcaneal bursitis, left - XR reviewed as above.  - Educated on icing and stretching. Instructions given.  - Injection delivered to the plantar calcaneal bursa as below. - DME: Recommend use of gel inserts.  Patient also has inserts from the [employer] store.  Recommend wearing good supportive shoes with cushioning at all times. - Pharmacologic management: Meloxicam 15 mg daily. Educated on risks/benefits and proper taking of medication.  Patient already has a supply of meloxicam at home and will begin taking this.  Procedure: Injection plantar calcaneal bursa left Location: Left plantar calcaneal bursa at the glabrous junction; medial approach. Skin Prep: alcohol Injectate: 1 cc 0.5% marcaine plain, 1 cc  kenalog 10. Disposition: Patient tolerated procedure well. Injection site dressed with a band-aid.  #Sinus tarsi syndrome right ankle -Educated on icing and stretching -Recommend meloxicam as well for this issue -Injection delivered to the sinus tarsi right ankle as below  Procedure: Injection right ankle sinus tarsi Location: Right ankle sinus tarsi lateral approach Skin Prep: alcohol Injectate: 1 cc 0.5% marcaine plain, 1 cc kenalog 10. Disposition: Patient tolerated procedure well. Injection site dressed with a band-aid.  Return in about 6 weeks  (around 03/15/2022) for R Sinus tarsi syndrome, Left heel bursitis.          Corinna Gab, DPM [facility]

## 2022-02-01 NOTE — Patient Instructions (Signed)

## 2022-03-18 ENCOUNTER — Ambulatory Visit (INDEPENDENT_AMBULATORY_CARE_PROVIDER_SITE_OTHER): Payer: BC Managed Care – PPO | Admitting: Podiatry

## 2022-03-18 DIAGNOSIS — Z91199 Patient's noncompliance with other medical treatment and regimen due to unspecified reason: Secondary | ICD-10-CM

## 2022-03-18 NOTE — Progress Notes (Signed)
Pt was a no show for apt, charge generated 

## 2022-05-29 DIAGNOSIS — R9431 Abnormal electrocardiogram [ECG] [EKG]: Secondary | ICD-10-CM | POA: Diagnosis not present

## 2022-05-29 DIAGNOSIS — S0990XA Unspecified injury of head, initial encounter: Secondary | ICD-10-CM | POA: Diagnosis not present

## 2022-05-29 DIAGNOSIS — W19XXXA Unspecified fall, initial encounter: Secondary | ICD-10-CM | POA: Diagnosis not present

## 2022-05-29 DIAGNOSIS — S0101XA Laceration without foreign body of scalp, initial encounter: Secondary | ICD-10-CM | POA: Diagnosis not present

## 2022-05-29 DIAGNOSIS — Z23 Encounter for immunization: Secondary | ICD-10-CM | POA: Diagnosis not present

## 2022-05-29 DIAGNOSIS — R55 Syncope and collapse: Secondary | ICD-10-CM | POA: Diagnosis not present

## 2022-07-12 ENCOUNTER — Encounter (HOSPITAL_COMMUNITY): Payer: Self-pay | Admitting: Registered Nurse

## 2022-07-12 ENCOUNTER — Ambulatory Visit (HOSPITAL_COMMUNITY)
Admission: EM | Admit: 2022-07-12 | Discharge: 2022-07-12 | Disposition: A | Discharge status: Home / Self Care | Payer: BC Managed Care – PPO | Attending: Registered Nurse | Admitting: Registered Nurse

## 2022-07-12 DIAGNOSIS — F4323 Adjustment disorder with mixed anxiety and depressed mood: Secondary | ICD-10-CM

## 2022-07-12 HISTORY — DX: Adjustment disorder with mixed anxiety and depressed mood: F43.23

## 2022-07-12 NOTE — ED Provider Notes (Signed)
Behavioral Health Urgent Care Medical Screening Exam  Patient Name: Aaron Fowler MRN: [id number] Date of Evaluation: 07/12/22 Chief Complaint:   Diagnosis:  Final diagnoses:  Adjustment disorder with mixed anxiety and depressed mood    History of Present illness: Aaron Fowler is a 52 y.o. male patient presented to [facility] as a walk in accompanied by his wife with complaints of depression and worsening anxiety  [name], 52 y.o., male patient seen face to face by this provider, consulted with Dr. Nelly Rout, and chart reviewed on 07/12/22.  On evaluation Aaron Fowler reports he came in today related to "losing my mind.  I had a nervous breakdown yesterday.  I was at work and I think I had a panic attack.  I got the tremors, I couldn't function, then uncontrollable crying.  It really scared me."  Patient states he has had periods of depression for the last 2 years that seen to be getting worse.  "Sometimes I feel worthless, that I will never amount to nothing.  When ever people say stuff I take it as a personal attack even though I know it wasn't ment like that.  I've lost interest in things I like to do.  It's like I don't care about anything."  He states his primary care doctor started him on Trintellix about 3 weeks ago (1st wk 5 mg, 2nd wk 10 mg, now taking 20 mg daily).  "Yesterday my doctor gave me a prescription for Klonopin just to take the edge off."  Patient denies prior psychiatric history other than the periods of depression for past 2 yrs and current prescribed psychotropic medications.  He denies prior outpatient psychiatric services or psychiatric hospitalization.  He also denies self harming behaviors and prior suicide attempt.  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  He lives with his wife and 3 sons ages 33, 30, and 74.  States his family is very supportive "I've had a good life and I've got a good family.  That's why I don't understand why I'm feeling  like this."  Patient denies suicidal/self-harm/homicidal ideation, psychosis, and paranoia.  States he is looking for outpatient psychiatric services and would prefer face to face visits.   During evaluation Aaron Fowler is sitting up right in chair dressed appropriate for weather.  There is no noted distress.  He is alert/oriented x 4, calm, cooperative, attentive, and responses were relevant and appropriate to assessment questions.  He spoke in a clear tone at moderate volume, and normal pace, with good eye contact.   He denies suicidal/self-harm/homicidal ideation, psychosis, and paranoia.  Objectively:  there is no evidence of psychosis/mania or delusional thinking.  He conversed coherently, with goal directed thoughts, and no distractibility, or pre-occupation.  At this time Aaron Fowler is educated and verbalizes understanding of mental health resources and other crisis services in the community. He is instructed to call 911 and present to the nearest emergency room should he experience any suicidal/homicidal ideation, auditory/visual/hallucinations, or detrimental worsening of his mental health condition.  He was a also advised by Clinical research associate that he could call the toll-free phone on back of  insurance card to assist with identifying counselors and agencies in network.   Flowsheet Row ED from 07/12/2022 in [facility]  C-SSRS RISK CATEGORY No Risk       Psychiatric Specialty Exam  Presentation  General Appearance:Appropriate for Environment; Casual  Eye Contact:Good  Speech:Clear and  Coherent; Normal Rate  Speech Volume:Normal  Handedness:Right   Mood and Affect  Mood: Anxious; Depressed  Affect: Congruent   Thought Process  Thought Processes: Coherent; Goal Directed  Descriptions of Associations:Intact  Orientation:Full (Time, Place and Person)  Thought Content:Logical    Hallucinations:None  Ideas of Reference:None  Suicidal  Thoughts:No  Homicidal Thoughts:No   Sensorium  Memory: Immediate Good; Recent Good; Remote Good  Judgment: Intact  Insight: Present   Executive Functions  Concentration: Good  Attention Span: Good  Recall: Good  Fund of Knowledge: Good  Language: Good   Psychomotor Activity  Psychomotor Activity: Normal   Assets  Assets: Communication Skills; Desire for Improvement; Financial Resources/Insurance; Housing; Intimacy; Leisure Time; Physical Health; Resilience; Social Support; Transportation   Sleep  Sleep: Good  Number of hours: No data recorded  Physical Exam: Physical Exam Vitals and nursing note reviewed.  Constitutional:      General: He is not in acute distress.    Appearance: Normal appearance. He is not ill-appearing.  HENT:     Head: Normocephalic.  Eyes:     Conjunctiva/sclera: Conjunctivae normal.  Cardiovascular:     Rate and Rhythm: Normal rate.  Pulmonary:     Effort: Pulmonary effort is normal. No respiratory distress.  Musculoskeletal:        General: Normal range of motion.     Cervical back: Normal range of motion.  Skin:    General: Skin is warm and dry.  Neurological:     Mental Status: He is alert and oriented to person, place, and time.  Psychiatric:        Attention and Perception: Attention and perception normal. He does not perceive auditory or visual hallucinations.        Mood and Affect: Mood is anxious and depressed.        Speech: Speech normal.        Behavior: Behavior normal. Behavior is cooperative.        Thought Content: Thought content normal. Thought content is not paranoid or delusional. Thought content does not include homicidal or suicidal ideation.        Cognition and Memory: Cognition and memory normal.        Judgment: Judgment normal.    Review of Systems  Constitutional:        No other complaints voiced  Psychiatric/Behavioral:  Positive for depression. Hallucinations: Denies. Substance  abuse: Denies. Suicidal ideas: Denies.The patient is nervous/anxious. The patient does not have insomnia.   All other systems reviewed and are negative.  Blood pressure 112/86, pulse 100, temperature 98 F (36.7 C), temperature source Oral, resp. rate 18, SpO2 98 %. There is no height or weight on file to calculate BMI.  Musculoskeletal: Strength & Muscle Tone: within normal limits Gait & Station: normal Patient leans: N/A    [facility] MSE Discharge Disposition for Follow up and Recommendations: Based on my evaluation the patient does not appear to have an emergency medical condition and can be discharged with resources and follow up care in outpatient services for Medication Management and Individual Therapy  Thursday Jul 14, 2022 at 3:00 PM virtual appointment for therapy intake with  Frederic Jericho, MSW, LCSW, [facility] [address] [place], [place] [address] Office [phone] Fax [phone]  Assunta Found, NP 07/12/2022, 3:33 PM

## 2022-07-12 NOTE — Progress Notes (Signed)
   07/12/22 1440  [facility] Triage Screening (Walk-ins at [facility] only)  How Did You Hear About [place]? Self  What Is the Reason for Your Visit/Call Today? Pt presents to [facility] voluntarily seeking therapy and psychiatry resources due to increased depression and anxiety symptoms. Pt reports a panic attack yesterday; shaking, crying and feels like he was having a nervous break down. Pt states "I need to do something before my wife leaves me". Pt states he cannot identify a specific trigger but states he has not found joy in life for several years. Pt denies SI/HI and AVH.  How Long Has This Been Causing You Problems? > than 6 months  Have You Recently Had Any Thoughts About Hurting Yourself? No  Are You Planning to Commit Suicide/Harm Yourself At This time? No  Have you Recently Had Thoughts About Hurting Someone Karolee Ohs? No  Are You Planning To Harm Someone At This Time? No  Are you currently experiencing any auditory, visual or other hallucinations? No  Have You Used Any Alcohol or Drugs in the Past 24 Hours? No  Do you have any current medical co-morbidities that require immediate attention? No  Clinician description of patient physical appearance/behavior: casually dressed, depressed mood congruent affect  What Do You Feel Would Help You the Most Today? Treatment for Depression or other mood problem  If access to [facility] was not available, would you have sought care in the Emergency Department? No  Determination of Need Routine (7 days)  Options For Referral Outpatient Therapy;Medication Management

## 2022-07-12 NOTE — Discharge Instructions (Addendum)
You are scheduled an appointment for therapy intake on Jul 14, 2022 at 3:00 PM.  Instructions for virtual appointment will be sent to your email.    Frederic Jericho, MSW, [place], [facility] [phone] [place], [place] [address] Office [phone] Fax [phone]  Sometimes in life trying situations occur that may be difficult to handle alone. My goal is to help you successfully manage these challenges so that you can experience a more balanced and fulfilling life. I offer an eclectic approach to therapy, which includes solution-focused, reality-based, person-centered approaches. I will assist you in breaking the cycle of negative thinking and behaviors. If you are in need of support or guidance in order to make your life more manageable, I welcome the opportunity to assist you in doing so. My goal is to empower you to live a life of personal growth and positive well-being. Please call [phone] or email [email or web address] for an individual and family therapy or consultation today.      Based on the information you have provided and the presenting issue, outpatient services and resources for have been recommended.  It is imperative that you follow through with treatment recommendations within 5-7 days from the of discharge to mitigate further risk to your safety and mental well-being. A list of referrals has been provided below to get you started.  You are not limited to the list provided.  In case of an urgent crisis, you may contact the Mobile Crisis Unit with [facility] at [phone].  Writer met with the patient and was able to link the patient with Frederic Jericho, LCSW to have an appointment with her on Thursday Jul 14, 2022 at 3pm virtually   Frederic Jericho, MSW, [place], [facility] [address] [place], [place] [address] Office [phone] Fax [phone]   I am pleased that you have selected me as your therapist.  I have a Child psychotherapist of Social  Work degree, obtained at the [employer] [place] in 2000.  I am licensed with the [facility] Social Work Public librarian [phone]).   The services offered to you include individual, family, and group counseling.  My therapeutic approach  is derived from my training and experience in Cognitive-Behavioral Therapy, Solution Focused Therapy, Reality Therapy, as well as, interpersonal and developmental theories of counseling.  I have special interests in anxiety, depression, sexual and physical abuse, play therapy, child and adolescent development, and family systems.  I am specifically trained in Trauma Focused-Cognitive Behavioral Therapy (TF-CBT).  I will not discriminate because of age, race, gender, sexual orientation or religion.  If there is anything I should know concerning your culture or religion, I ask that you please inform me so that I can better understand you.  If for any reason, I determine that my knowledge and expertise are not sufficient for your particular needs, I will make every effort to refer you to another counselor who is prepared to work more effectively with you.  Are you or a loved one dealing with the aftermath of a traumatic experience? Do life transitions leave you grappling with feelings of depression or anxiety? Are you concerned about your child's negative behaviors, sleep disturbances, or difficulties during a divorce? It's important to understand that children primarily operate based on emotions, while adults tend to use rational thinking. This emotional focus can impact a child's ability to make effective decisions and express their feelings.   Services Individual Counseling  Individual counseling is a personal opportunity to receive support and experience growth during challenging times in life. Individual counseling can help one deal with many personal topics in life such as anger, depression, anxiety, substance abuse, marriage and  relationship challenges, parenting problems, school difficulties, career changes, etc.  Family Counseling  Families often are faced with issues and challenges that greatly impact all the members. The goal of family therapy is to renew and maintain the natural family structure and support group with an emphasis on returning the individual to a healthy family and community life. Family counseling can help improve communication, resolve conflict, and improve connections. [/one-half-first]    Parenting Coordinator Parent Coordinator is a neutral third party acting in the children's best interest attempting to reach a fair compromise of the issues at hand. Assisting parents manage their parenting plan, improve communication, and resolve disputes. A court appointment neutral party.     Treatment specialization includes: Depression  Anxiety  Attention Deficit/Hyperactive Disorder  Grief Counseling  Conflict Resolution  Emotional Regulation  Family Counseling  Divorce/Separation  Mediation  Conflict Resolution  Relationship Building  Parenting Coordinator  Parental Support

## 2022-07-18 DIAGNOSIS — F411 Generalized anxiety disorder: Secondary | ICD-10-CM | POA: Diagnosis not present

## 2022-07-22 DIAGNOSIS — F411 Generalized anxiety disorder: Secondary | ICD-10-CM | POA: Diagnosis not present

## 2022-07-28 ENCOUNTER — Ambulatory Visit (INDEPENDENT_AMBULATORY_CARE_PROVIDER_SITE_OTHER): Payer: BLUE CROSS/BLUE SHIELD | Admitting: Behavioral Health

## 2022-07-28 ENCOUNTER — Encounter: Payer: Self-pay | Admitting: Behavioral Health

## 2022-07-28 VITALS — BP 117/83 | HR 101 | Ht 73.0 in | Wt 209.0 lb

## 2022-07-28 DIAGNOSIS — R454 Irritability and anger: Secondary | ICD-10-CM

## 2022-07-28 DIAGNOSIS — F39 Unspecified mood [affective] disorder: Secondary | ICD-10-CM

## 2022-07-28 DIAGNOSIS — F411 Generalized anxiety disorder: Secondary | ICD-10-CM | POA: Diagnosis not present

## 2022-07-28 DIAGNOSIS — F331 Major depressive disorder, recurrent, moderate: Secondary | ICD-10-CM | POA: Diagnosis not present

## 2022-07-28 MED ORDER — OLANZAPINE 5 MG PO TABS: ORAL_TABLET | ORAL | 1 refills | Status: DC

## 2022-07-28 MED ORDER — LAMOTRIGINE 25 MG PO TABS: ORAL_TABLET | ORAL | 1 refills | Status: DC

## 2022-07-28 NOTE — Progress Notes (Signed)
[facility] MD/PA/NP Initial Note  07/28/2022 5:23 PM Aaron Fowler  MRN:  [id number]  Chief Complaint:  Chief Complaint   Anxiety; Depression; Patient Education; Establish Care; Anger     HPI:   "Aaron Fowler", 52 year old male presents to this office for initial visit and to establish care.  Collateral information should be considered reliable.  Patient appears to be in severe distress and is trembling at times.  Has severe psychomotor agitation in both legs bilaterally.  Very restless and begins to cry.  When asked how I can help him, he replies; "I am just really fucked up right now".  He says that his moods are all over the place with highs and lows cycling about every 3 days.  Says that it is starting to affect his marriage and that he has got to have some help.  Says that he is only sleeping about 5 hours per night with the aid of Ambien.  Says that his anxiety became so bad that he had to take time off from work.  Says that he does not understand any known triggers because he has a good job, I good wife, and life has been good.  Says he does not understand why he is so messed up right now.  Says that he is anger is really bad and that he sometimes lashes out by throwing or breaking things.  Sometimes punching walls.  Says that he has never hurt anybody and never we will says that he has never put his hands on someone and usually lashes out at objects.  He says that he wants to avoid hospitalization at all cost and that he will not go to the hospital no matter what.  He endorses severe irritability, periods of time where he feels more confident than usual, racing thoughts, increased interest in sex, and poor impulse control.  Says that his mind will not shut down and that anxiety becomes more severe in the evening hours.  He says that he is desperate for help before it destroys his life.  Says that he recently sought care from his PCP and is only been on Trintellix for 2 weeks.  However says that he  remembers suffering from depression and anxiety most of his adult life but did not seek care.  He rates his depression at 7/10 and his anxiety at 10/10.  His PHQ-9 was 20.  His MDQ had 6 of the 11 criteria on marked yes.  He endorses periods of hyperactivity followed by severe depression.  He denies any psychosis or auditory or visual hallucinations.  States that he has had some passive SI in the past but has never made a plan.  Says that he would not harm self due to the love he has for his family.  He verbally contracted for safety with this Clinical research associate.  No HI Past psychiatric medication trials:  Trintellix Ambien        Visit Diagnosis:    ICD-10-CM   1. Unspecified mood (affective) disorder (HCC)  F39 lamoTRIgine (LAMICTAL) 25 MG tablet    OLANZapine (ZYPREXA) 5 MG tablet    2. Major depressive disorder, recurrent episode, moderate (HCC)  F33.1 OLANZapine (ZYPREXA) 5 MG tablet    3. Generalized anxiety disorder  F41.1 OLANZapine (ZYPREXA) 5 MG tablet    4. Anger  R45.4 lamoTRIgine (LAMICTAL) 25 MG tablet    OLANZapine (ZYPREXA) 5 MG tablet      Past Psychiatric History: Anxiety, Panic Attack, MDD  Past Medical History:  Past  Medical History:  Diagnosis Date   Psoriasis    per patient     Past Surgical History:  Procedure Laterality Date   ANAL FISSURE REPAIR     BONE MARROW BIOPSY     CATARACT EXTRACTION, BILATERAL     10/28/2019, 12/16/2019    CYST EXCISION     on buttock    LYMPH NODE BIOPSY      Family Psychiatric History: see chart  Family History:  Family History  Problem Relation Age of Onset   Heart Problems Mother    Alcohol abuse Father    Prostate cancer Father    Autoimmune disease Sister    Dementia Maternal Grandmother     Social History:  Social History   Socioeconomic History   Marital status: Married    Spouse name: Aaron Fowler   Number of children: 2   Years of education: 12   Highest education level: GED or equivalent  Occupational History    Occupation: Zone Occupational hygienist    Comment: [employer]  Tobacco Use   Smoking status: Former   Smokeless tobacco: Never  Building services engineer Use: Former  Substance and Sexual Activity   Alcohol use: Yes    Comment: light social drinker, will drink to intoxication occasionally   Drug use: Not Currently   Sexual activity: Yes  Other Topics Concern   Not on file  Social History Narrative   Not on file   Social Determinants of Health   Financial Resource Strain: Not on file  Food Insecurity: Not on file  Transportation Needs: Not on file  Physical Activity: Not on file  Stress: Not on file  Social Connections: Not on file    Allergies:  Allergies  Allergen Reactions   Sulfamethoxazole Rash    Other reaction(s): Fever (intolerance)    Metabolic Disorder Labs: No results found for: "HGBA1C", "MPG" No results found for: "PROLACTIN" No results found for: "CHOL", "TRIG", "HDL", "CHOLHDL", "VLDL", "LDLCALC" No results found for: "TSH"  Therapeutic Level Labs: No results found for: "LITHIUM" No results found for: "VALPROATE" No results found for: "CBMZ"  Current Medications: Current Outpatient Medications  Medication Sig Dispense Refill   lamoTRIgine (LAMICTAL) 25 MG tablet Take 1/2 tablet by mouth for 14 days, then take two tablets 50 mg total daily. 60 tablet 1   meloxicam (MOBIC) 15 MG tablet Take 15 mg by mouth daily.     OLANZapine (ZYPREXA) 5 MG tablet Take 1/2 tablet by mouth for 5 days, then take one whole tablet daily at bedtime. 30 tablet 1   tadalafil (CIALIS) 5 MG tablet Take 5 mg by mouth daily.     TRINTELLIX 20 MG TABS tablet Take 20 mg by mouth daily.     omeprazole (PRILOSEC) 20 MG capsule Take 20 mg by mouth daily.     predniSONE (DELTASONE) 5 MG tablet TAKE 2 TABLET BY MOUTH EVERY DAY     tiZANidine (ZANAFLEX) 4 MG tablet Take 4 mg by mouth 3 (three) times daily.     zolpidem (AMBIEN) 10 MG tablet Take 10 mg by mouth.     No current  facility-administered medications for this visit.    Medication Side Effects: none  Orders placed this visit:  No orders of the defined types were placed in this encounter.   Psychiatric Specialty Exam:  Review of Systems  Blood pressure 117/83, pulse (!) 101, height 6\' 1"  (1.854 m), weight 209 lb (94.8 kg).Body mass index is 27.57 kg/m.  General  Appearance: Casual, Neat, and Well Groomed  Eye Contact:  Good  Speech:  Talkative  Volume:  Increased  Mood:  Anxious, Depressed, Dysphoric, and Irritable  Affect:  Congruent, Depressed, Tearful, Anxious, and Irritable  Thought Process:  Coherent  Orientation:  Full (Time, Place, and Person)  Thought Content: Logical   Suicidal Thoughts:  No  Homicidal Thoughts:  No  Memory:  WNL  Judgement:  Fair  Insight:  Good  Psychomotor Activity:  Increased and Restlessness  Concentration:  Concentration: Good  Recall:  Good  Fund of Knowledge: Fair  Language: Good  Assets:  Desire for Improvement Intimacy Physical Health Resilience Social Support  ADL's:  Intact  Cognition: WNL  Prognosis:  Good   Screenings:  PHQ2-9    Flowsheet Row Office Visit from 07/28/2022 in [facility]  PHQ-2 Total Score 6  PHQ-9 Total Score 20       Receiving Psychotherapy: No   Treatment Plan/Recommendations:   Greater than 50% of 60 min face to face time with patient was spent on counseling and coordination of care. We discussed his reported long-term problems with severe anxiety and depression stemming back 30 years.  We discussed his recent plan of care with PCP and medication trials.  We discussed his goals for care at this office.  We reconciled and reviewed all of his medications.  We talked about new medication options and medication safety.  We agreed today to: Patient will continue Trintellix 20 mg daily Will continue Ambien 10 mg at bedtime daily To start olanzapine 2.5 mg for 5 days, then 5 mg daily To start  Lamictal 25 mg for 14 days, then 50 mg daily Will report worsening symptoms or side effects immediately Provided emergency contact information to include 911, nearest ER, [facility], or after-hours emergency line. Monitor for any sign of rash. Please taking Lamictal and contact office immediately rash develops. Recommend seeking urgent medical attention if rash is severe and/or spreading quickly.  Discussed potential metabolic side effects associated with atypical antipsychotics, as well as potential risk for movement side effects. Advised pt to contact office if movement side effects occur.   Reviewed PDMP  Joan Flores, NP

## 2022-08-19 ENCOUNTER — Other Ambulatory Visit: Payer: Self-pay | Admitting: Behavioral Health

## 2022-08-19 DIAGNOSIS — R454 Irritability and anger: Secondary | ICD-10-CM

## 2022-08-19 DIAGNOSIS — F411 Generalized anxiety disorder: Secondary | ICD-10-CM

## 2022-08-19 DIAGNOSIS — F39 Unspecified mood [affective] disorder: Secondary | ICD-10-CM

## 2022-08-19 DIAGNOSIS — F331 Major depressive disorder, recurrent, moderate: Secondary | ICD-10-CM

## 2022-08-19 NOTE — Telephone Encounter (Signed)
New meds started at last visit, pt has apt on 6/21. 90 day request  Wait till office visit with Arlys John in case of any changes.

## 2022-08-26 ENCOUNTER — Ambulatory Visit: Payer: BC Managed Care – PPO | Admitting: Behavioral Health

## 2022-09-02 DIAGNOSIS — E785 Hyperlipidemia, unspecified: Secondary | ICD-10-CM | POA: Diagnosis not present

## 2022-09-02 DIAGNOSIS — Z125 Encounter for screening for malignant neoplasm of prostate: Secondary | ICD-10-CM | POA: Diagnosis not present

## 2022-09-02 DIAGNOSIS — Z0000 Encounter for general adult medical examination without abnormal findings: Secondary | ICD-10-CM | POA: Diagnosis not present

## 2022-09-02 DIAGNOSIS — Z7689 Persons encountering health services in other specified circumstances: Secondary | ICD-10-CM | POA: Diagnosis not present

## 2022-09-09 ENCOUNTER — Ambulatory Visit (INDEPENDENT_AMBULATORY_CARE_PROVIDER_SITE_OTHER): Payer: BC Managed Care – PPO | Admitting: Behavioral Health

## 2022-09-09 ENCOUNTER — Encounter: Payer: Self-pay | Admitting: Behavioral Health

## 2022-09-09 DIAGNOSIS — F411 Generalized anxiety disorder: Secondary | ICD-10-CM

## 2022-09-09 DIAGNOSIS — F39 Unspecified mood [affective] disorder: Secondary | ICD-10-CM | POA: Diagnosis not present

## 2022-09-09 DIAGNOSIS — F331 Major depressive disorder, recurrent, moderate: Secondary | ICD-10-CM | POA: Diagnosis not present

## 2022-09-09 DIAGNOSIS — F99 Mental disorder, not otherwise specified: Secondary | ICD-10-CM

## 2022-09-09 DIAGNOSIS — R454 Irritability and anger: Secondary | ICD-10-CM | POA: Diagnosis not present

## 2022-09-09 DIAGNOSIS — F5105 Insomnia due to other mental disorder: Secondary | ICD-10-CM

## 2022-09-09 MED ORDER — LAMOTRIGINE 100 MG PO TABS: 100.0000 mg | ORAL_TABLET | Freq: Every day | ORAL | 2 refills | Status: DC

## 2022-09-09 MED ORDER — ZOLPIDEM TARTRATE 10 MG PO TABS: 10.0000 mg | ORAL_TABLET | Freq: Every day | ORAL | 2 refills | Status: DC

## 2022-09-09 MED ORDER — OLANZAPINE 5 MG PO TABS: ORAL_TABLET | ORAL | 2 refills | Status: DC
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
## 2022-09-30 ENCOUNTER — Ambulatory Visit (INDEPENDENT_AMBULATORY_CARE_PROVIDER_SITE_OTHER): Payer: BC Managed Care – PPO | Admitting: Professional Counselor

## 2022-09-30 ENCOUNTER — Encounter: Payer: Self-pay | Admitting: Professional Counselor

## 2022-09-30 DIAGNOSIS — F39 Unspecified mood [affective] disorder: Secondary | ICD-10-CM

## 2022-09-30 DIAGNOSIS — F331 Major depressive disorder, recurrent, moderate: Secondary | ICD-10-CM

## 2022-09-30 DIAGNOSIS — F411 Generalized anxiety disorder: Secondary | ICD-10-CM | POA: Diagnosis not present
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
## 2022-10-07 ENCOUNTER — Encounter: Payer: Self-pay | Admitting: Professional Counselor

## 2022-10-07 ENCOUNTER — Ambulatory Visit (INDEPENDENT_AMBULATORY_CARE_PROVIDER_SITE_OTHER): Payer: BC Managed Care – PPO | Admitting: Professional Counselor

## 2022-10-07 DIAGNOSIS — F331 Major depressive disorder, recurrent, moderate: Secondary | ICD-10-CM

## 2022-10-07 DIAGNOSIS — F411 Generalized anxiety disorder: Secondary | ICD-10-CM | POA: Diagnosis not present

## 2022-10-07 DIAGNOSIS — F39 Unspecified mood [affective] disorder: Secondary | ICD-10-CM | POA: Diagnosis not present

## 2022-10-07 NOTE — Progress Notes (Unsigned)
      [facility] Counselor/Therapist Progress Note  Patient ID: Aaron Fowler, MRN: [id number],    Date: 10/07/2022  Time Spent: 3:08p - 4:14p   Treatment Type: Individual Therapy  Reported Symptoms: anxiety, depression, mood instability, anger concerns   Mental Status Exam:  Appearance:   Neat     Behavior:  Appropriate, Sharing, and Motivated  Motor:  Normal  Speech/Language:   Clear and Coherent and Normal Rate  Affect:  Appropriate and Congruent  Mood:  normal  Thought process:  normal  Thought content:    WNL  Sensory/Perceptual disturbances:    WNL  Orientation:  oriented to person, place, time/date, and situation  Attention:  Good  Concentration:  Good  Memory:  WNL  Fund of knowledge:   Good  Insight:    Good  Judgment:   Good  Impulse Control:  Good   Risk Assessment: Danger to Self:  No Self-injurious Behavior: No Danger to Others: No Duty to Warn:no Physical Aggression / Violence:No  Access to Firearms a concern: No  Gang Involvement:No   Subjective: Pt presented to session continuing to share regarding his experience of mood swings and voicing that he has been on a happier note in the past several days. Counselor and pt discussed rapid cycling where diagnosis concerned. Pt continued to process his concerns within his marriage, and he identified the different facets of the relationship and what he feels is harmonious versus being in need of work. He identified sexual health as a strong facet and priority at this time. He processed his intimacy concerns and he and counselor discussed suspected reasons such as his innate desire to provide for spouse's needs, and how his sense of being in a challenging place mentally and having this role compromised creates pressure. Counselor and pt discussed pt self esteem, including high incidence of self blame, and his sense that he is in a phase of his life where he feels he is not managing his sense of command interpersonally,  and regarding issues in primary relationship with organizing around external locus of control that may undermine his overarching value/goal of serving the other.  Interventions: Solution-Oriented/Positive Psychology, Humanistic/Existential, and Insight-Oriented  Diagnosis:   ICD-10-CM   1. Major depressive disorder, recurrent episode, moderate (HCC)  F33.1     2. Generalized anxiety disorder  F41.1     3. Unspecified mood (affective) disorder (HCC)  F39       Plan: Pt is scheduled for a follow-up; continue process work and developing coping skills; discuss treatment plan and obtain consent.  Gaspar Bidding, [facility]

## 2022-10-11 ENCOUNTER — Other Ambulatory Visit: Payer: Self-pay | Admitting: Behavioral Health

## 2022-10-11 DIAGNOSIS — F39 Unspecified mood [affective] disorder: Secondary | ICD-10-CM

## 2022-10-11 DIAGNOSIS — R454 Irritability and anger: Secondary | ICD-10-CM

## 2022-10-11 DIAGNOSIS — F411 Generalized anxiety disorder: Secondary | ICD-10-CM

## 2022-10-11 DIAGNOSIS — F331 Major depressive disorder, recurrent, moderate: Secondary | ICD-10-CM

## 2022-10-14 ENCOUNTER — Encounter: Payer: Self-pay | Admitting: Professional Counselor

## 2022-10-14 ENCOUNTER — Ambulatory Visit (INDEPENDENT_AMBULATORY_CARE_PROVIDER_SITE_OTHER): Payer: BC Managed Care – PPO | Admitting: Professional Counselor

## 2022-10-14 DIAGNOSIS — F331 Major depressive disorder, recurrent, moderate: Secondary | ICD-10-CM

## 2022-10-14 DIAGNOSIS — F39 Unspecified mood [affective] disorder: Secondary | ICD-10-CM | POA: Diagnosis not present

## 2022-10-14 DIAGNOSIS — F411 Generalized anxiety disorder: Secondary | ICD-10-CM

## 2022-10-14 NOTE — Progress Notes (Unsigned)
      [facility] Counselor/Therapist Progress Note  Patient ID: RICHMOND BOUNDS, MRN: [id number],    Date: 10/14/2022  Time Spent: ***   Treatment Type: Individual Therapy  Reported Symptoms: ***  Mental Status Exam:  Appearance:   Neat     Behavior:  Appropriate, Sharing, and Motivated  Motor:  Normal  Speech/Language:   Clear and Coherent and Normal Rate  Affect:  Appropriate and Congruent  Mood:  normal  Thought process:  normal  Thought content:    WNL  Sensory/Perceptual disturbances:    WNL  Orientation:  oriented to person, place, time/date, and situation  Attention:  Good  Concentration:  Good  Memory:  WNL  Fund of knowledge:   Good  Insight:    Good  Judgment:   Good  Impulse Control:  Good   Risk Assessment: Danger to Self:  No Self-injurious Behavior: No Danger to Others: No Duty to Warn:no Physical Aggression / Violence:No  Access to Firearms a concern: No  Gang Involvement:No   Subjective: ***   Interventions: Assertiveness/Communication, Solution-Oriented/Positive Psychology, Humanistic/Existential, and Insight-Oriented  Diagnosis:   ICD-10-CM   1. Major depressive disorder, recurrent episode, moderate (HCC)  F33.1     2. Generalized anxiety disorder  F41.1     3. Unspecified mood (affective) disorder (HCC)  F39       Plan: ***  Gaspar Bidding, [facility]

## 2022-10-21 ENCOUNTER — Encounter: Payer: Self-pay | Admitting: Professional Counselor

## 2022-10-21 ENCOUNTER — Ambulatory Visit (INDEPENDENT_AMBULATORY_CARE_PROVIDER_SITE_OTHER): Payer: BC Managed Care – PPO | Admitting: Professional Counselor

## 2022-10-21 DIAGNOSIS — F39 Unspecified mood [affective] disorder: Secondary | ICD-10-CM | POA: Diagnosis not present

## 2022-10-21 DIAGNOSIS — F411 Generalized anxiety disorder: Secondary | ICD-10-CM | POA: Diagnosis not present

## 2022-10-21 DIAGNOSIS — F331 Major depressive disorder, recurrent, moderate: Secondary | ICD-10-CM

## 2022-10-21 NOTE — Progress Notes (Unsigned)
      [facility] Counselor/Therapist Progress Note  Patient ID: Aaron Fowler, MRN: [id number],    Date: 10/21/2022  Time Spent: 12:02p - 1:22p   Treatment Type: Individual Therapy  Reported Symptoms: restlessness, intrusive thoughts, low mood, negative self beliefs, ruminations, mood swings, somatic pain, interpersonal concerns   Mental Status Exam:  Appearance:   Neat     Behavior:  Appropriate, Sharing, and Motivated  Motor:  Normal  Speech/Language:   Clear and Coherent and Normal Rate  Affect:  Appropriate and Congruent  Mood:  normal  Thought process:  normal  Thought content:    WNL  Sensory/Perceptual disturbances:    WNL  Orientation:  oriented to person, place, time/date, and situation  Attention:  Good  Concentration:  Good  Memory:  WNL  Fund of knowledge:   Good  Insight:    Good  Judgment:   Good  Impulse Control:  Good   Risk Assessment: Danger to Self:  No Self-injurious Behavior: No Danger to Others: No Duty to Warn:no Physical Aggression / Violence:No  Access to Firearms a concern: No  Gang Involvement:No   Subjective: Pt presented to session exploring dynamics and patterns in marital relationship and reflecting on recent events of both concern and celebration. He expressed the highs and lows of both his internal experience of life and within the marriage to be challenging for him. Counselor assisted in facilitating insight around pt sense of balance to both his sense of his intuition and to reality, and helped to bring question to pt negative thought patterns via CBT intervention. Pt reflected on the deep and steadfast love that he and spouse share. Pt helped pt to resource sense of new ways to approach conflict and nurturing of connection. Pt identified strong need for mirroring and attunement. Pt grieved loss of father and recounted difficulty with proceedings at that time; counselor helped facilitate insight around pt experience of grief and loss, and  she and pt discussed pt internalization of father's suffering.    Interventions: Solution-Oriented/Positive Psychology, Humanistic/Existential, and Insight-Oriented, CBT  Diagnosis:   ICD-10-CM   1. Major depressive disorder, recurrent episode, moderate (HCC)  F33.1     2. Generalized anxiety disorder  F41.1     3. Unspecified mood (affective) disorder (HCC)  F39       Plan: Pt is scheduled for a follow-up; continue process work and developing coping skills. Pt spouse to potentially join next session for family therapy. Pt to continue to resource sense of balance of approach.  Gaspar Bidding, [facility]

## 2022-10-26 DIAGNOSIS — H353131 Nonexudative age-related macular degeneration, bilateral, early dry stage: Secondary | ICD-10-CM | POA: Diagnosis not present

## 2022-10-28 ENCOUNTER — Ambulatory Visit: Payer: BC Managed Care – PPO | Admitting: Behavioral Health

## 2022-11-04 ENCOUNTER — Ambulatory Visit (INDEPENDENT_AMBULATORY_CARE_PROVIDER_SITE_OTHER): Payer: BC Managed Care – PPO | Admitting: Professional Counselor

## 2022-11-04 ENCOUNTER — Encounter: Payer: Self-pay | Admitting: Professional Counselor

## 2022-11-04 DIAGNOSIS — F39 Unspecified mood [affective] disorder: Secondary | ICD-10-CM | POA: Diagnosis not present

## 2022-11-04 DIAGNOSIS — F331 Major depressive disorder, recurrent, moderate: Secondary | ICD-10-CM | POA: Diagnosis not present

## 2022-11-04 DIAGNOSIS — F411 Generalized anxiety disorder: Secondary | ICD-10-CM

## 2022-11-04 NOTE — Progress Notes (Signed)
      [facility] Counselor/Therapist Progress Note  Patient ID: JEYCOB CHARETTE, MRN: [id number],    Date: 11/04/2022  Time Spent: 12:03p - 1:08p  Treatment Type: Individual Therapy  Reported Symptoms: preoccupying thoughts, worries, emotional dysregulation, mood swings, stress, interpersonal concerns   Mental Status Exam:  Appearance:   Neat     Behavior:  Appropriate, Sharing, and Motivated  Motor:  Normal  Speech/Language:   Clear and Coherent and Normal Rate  Affect:  Appropriate and Congruent  Mood:  normal  Thought process:  normal  Thought content:    WNL  Sensory/Perceptual disturbances:    WNL  Orientation:  oriented to person, place, time/date, and situation  Attention:  Good  Concentration:  Good  Memory:  WNL  Fund of knowledge:   Good  Insight:    Good  Judgment:   Good  Impulse Control:  Good   Risk Assessment: Danger to Self:  No Self-injurious Behavior: No Danger to Others: No Duty to Warn:no Physical Aggression / Violence:No  Access to Firearms a concern: No  Gang Involvement:No   Subjective:  Pt presented to session voicing progress in self esteem and feeling more emotionally stable, however voiced desire to address emotional dysregulation in treatment, particularly as relates episodes of anger escalation. Pt reported having better communication and productive talks with spouse. Pt and counselor discussed pt hx of low self esteem and origin, including as relates family of origin and dynamics and messaging therein. Pt identified self care strategies that help mitigate symptoms, including time at gym, and working to be a more independent while still being a vital partner. Pt and counselor discussed pt emotional orientation to self and self within  external context, and discussed emotional safety for him as he navigates choices in his life and marriage. They discussed couples counseling as potentially helpful, and counselor helped facilitate insight regarding  the role of alcohol in marital concerns. Pt voiced individual counseling as helpful in his pursuit of therapeutic goals. Pt and counselor discussed treatment plan and again encountered technical difficulties to finalization in system; pt and counselor will readdress next session.  Interventions: Solution-Oriented/Positive Psychology, Humanistic/Existential, and Insight-Oriented  Diagnosis:   ICD-10-CM   1. Major depressive disorder, recurrent episode, moderate (HCC)  F33.1     2. Generalized anxiety disorder  F41.1     3. Unspecified mood (affective) disorder (HCC)  F39       Plan: Pt is scheduled for a follow-up; continue treatment plan, process work, developing coping skills.   Gaspar Bidding, [facility]

## 2022-11-11 ENCOUNTER — Ambulatory Visit: Payer: BC Managed Care – PPO | Admitting: Professional Counselor

## 2022-11-18 ENCOUNTER — Ambulatory Visit: Payer: BC Managed Care – PPO | Admitting: Behavioral Health

## 2022-11-18 ENCOUNTER — Encounter: Payer: Self-pay | Admitting: Behavioral Health

## 2022-11-18 DIAGNOSIS — F411 Generalized anxiety disorder: Secondary | ICD-10-CM

## 2022-11-18 DIAGNOSIS — F39 Unspecified mood [affective] disorder: Secondary | ICD-10-CM | POA: Diagnosis not present

## 2022-11-18 DIAGNOSIS — R454 Irritability and anger: Secondary | ICD-10-CM | POA: Diagnosis not present

## 2022-11-18 DIAGNOSIS — F5105 Insomnia due to other mental disorder: Secondary | ICD-10-CM

## 2022-11-18 DIAGNOSIS — F331 Major depressive disorder, recurrent, moderate: Secondary | ICD-10-CM

## 2022-11-18 DIAGNOSIS — F99 Mental disorder, not otherwise specified: Secondary | ICD-10-CM

## 2022-11-18 MED ORDER — TRINTELLIX 20 MG PO TABS: 20.0000 mg | ORAL_TABLET | Freq: Every day | ORAL | 3 refills | Status: DC

## 2022-11-18 MED ORDER — ZOLPIDEM TARTRATE 10 MG PO TABS: 10.0000 mg | ORAL_TABLET | Freq: Every day | ORAL | 2 refills | Status: DC

## 2022-11-18 MED ORDER — LAMOTRIGINE 100 MG PO TABS: 100.0000 mg | ORAL_TABLET | Freq: Every day | ORAL | 2 refills | Status: DC

## 2022-11-18 NOTE — Progress Notes (Signed)
[facility] Med Check  Patient ID: BUB FALKOWITZ,  MRN: [id number]  PCP: Hal Morales, NP  Date of Evaluation: 11/18/2022 Time spent:30 minutes  Chief Complaint:  Chief Complaint   Depression; Anxiety; Family Problem; Stress; Medication Problem; Patient Education; Medication Refill     HISTORY/CURRENT STATUS: HPI  "Jhovani", 52 year old male presents to this office for initial visit and to establish care.  Collateral information should be considered reliable.  His face is red, eyes puffy and look like he has not had sleep. Says he was arguiing with wife and did not have a good night last night but that last month has been good. He stopped Olanzapine because he said it made him to lethargic the next day. He does not want to try another medication right now. Say he feels better and happy with current medications. He rates his depression at 4/10 and his anxiety at 5/10. He endorses periods of hyperactivity followed by severe depression. No current mania. He denies any psychosis or auditory or visual hallucinations.  He verbally contracted for safety with this Clinical research associate.     Past psychiatric medication trials:   Trintellix Ambien   Individual Medical History/ Review of Systems: Changes? :No   Allergies: Sulfamethoxazole  Current Medications:  Current Outpatient Medications:    lamoTRIgine (LAMICTAL) 100 MG tablet, Take 1 tablet (100 mg total) by mouth daily., Disp: 30 tablet, Rfl: 2   lamoTRIgine (LAMICTAL) 25 MG tablet, TAKE 1/2 TABLET BY MOUTH FOR 14 DAYS, THEN TAKE TWO TABLETS 50 MG TOTAL DAILY., Disp: 180 tablet, Rfl: 1   meloxicam (MOBIC) 15 MG tablet, Take 15 mg by mouth daily., Disp: , Rfl:    omeprazole (PRILOSEC) 20 MG capsule, Take 20 mg by mouth daily., Disp: , Rfl:    predniSONE (DELTASONE) 5 MG tablet, TAKE 2 TABLET BY MOUTH EVERY DAY, Disp: , Rfl:    tadalafil (CIALIS) 5 MG tablet, Take 5 mg by mouth daily., Disp: , Rfl:    tiZANidine (ZANAFLEX) 4 MG tablet, Take 4  mg by mouth 3 (three) times daily., Disp: , Rfl:    TRINTELLIX 20 MG TABS tablet, Take 1 tablet (20 mg total) by mouth daily., Disp: 30 tablet, Rfl: 3   zolpidem (AMBIEN) 10 MG tablet, Take 1 tablet (10 mg total) by mouth at bedtime., Disp: 30 tablet, Rfl: 2 Medication Side Effects: none  Family Medical/ Social History: Changes? No  MENTAL HEALTH EXAM:  There were no vitals taken for this visit.There is no height or weight on file to calculate BMI.  General Appearance: Casual, Neat, and Well Groomed  Eye Contact:  Good  Speech:  Clear and Coherent  Volume:  Normal  Mood:  Anxious, Depressed, and Dysphoric  Affect:  Appropriate  Thought Process:  Coherent  Orientation:  Full (Time, Place, and Person)  Thought Content: Logical   Suicidal Thoughts:  No  Homicidal Thoughts:  No  Memory:  WNL  Judgement:  Good  Insight:  Good  Psychomotor Activity:  Normal  Concentration:  Concentration: Good  Recall:  Good  Fund of Knowledge: Good  Language: Good  Assets:  Desire for Improvement  ADL's:  Intact  Cognition: WNL  Prognosis:  Good    DIAGNOSES:    ICD-10-CM   1. Generalized anxiety disorder  F41.1     2. Anger  R45.4 lamoTRIgine (LAMICTAL) 100 MG tablet    TRINTELLIX 20 MG TABS tablet    3. Unspecified mood (affective) disorder (HCC)  F39 lamoTRIgine (LAMICTAL) 100 MG  tablet    TRINTELLIX 20 MG TABS tablet    4. Major depressive disorder, recurrent episode, moderate (HCC)  F33.1     5. Insomnia due to other mental disorder  F51.05 zolpidem (AMBIEN) 10 MG tablet   F99       Receiving Psychotherapy: No    RECOMMENDATIONS:  Greater than 50% of 30  min face to face time with patient was spent on counseling and coordination of care. We discussed his moderate improvement with anxiety and depression and well as moods but he is still not well. No medication adjustment today. Continues to have intense arguments with his wife.  We agreed today to: Stopped Olanzapine Patient  will continue Trintellix 20 mg daily Will continue Ambien 10 mg at bedtime daily To continue  olanzapine 5 mg daily Increase  Lamictal to 100 mg daily. Will report worsening symptoms or side effects immediately Provided emergency contact information to include 911, nearest ER, [facility], or after-hours emergency line. Monitor for any sign of rash. Please taking Lamictal and contact office immediately rash develops. Recommend seeking urgent medical attention if rash is severe and/or spreading quickly.  Discussed potential metabolic side effects associated with atypical antipsychotics, as well as potential risk for movement side effects. Advised pt to contact office if movement side effects occur.   Reviewed PDMP   Joan Flores, NP     Joan Flores, NP

## 2022-11-25 ENCOUNTER — Encounter: Payer: Self-pay | Admitting: Professional Counselor

## 2022-11-25 ENCOUNTER — Ambulatory Visit: Payer: BC Managed Care – PPO | Admitting: Professional Counselor

## 2022-11-25 DIAGNOSIS — F411 Generalized anxiety disorder: Secondary | ICD-10-CM | POA: Diagnosis not present

## 2022-11-25 DIAGNOSIS — F39 Unspecified mood [affective] disorder: Secondary | ICD-10-CM

## 2022-11-25 DIAGNOSIS — F331 Major depressive disorder, recurrent, moderate: Secondary | ICD-10-CM

## 2022-11-25 DIAGNOSIS — F314 Bipolar disorder, current episode depressed, severe, without psychotic features: Secondary | ICD-10-CM
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
## 2022-12-02 ENCOUNTER — Encounter: Payer: Self-pay | Admitting: Professional Counselor

## 2022-12-02 ENCOUNTER — Ambulatory Visit: Payer: BC Managed Care – PPO | Admitting: Professional Counselor

## 2022-12-02 DIAGNOSIS — F411 Generalized anxiety disorder: Secondary | ICD-10-CM

## 2022-12-02 DIAGNOSIS — F331 Major depressive disorder, recurrent, moderate: Secondary | ICD-10-CM

## 2022-12-02 DIAGNOSIS — F39 Unspecified mood [affective] disorder: Secondary | ICD-10-CM | POA: Diagnosis not present

## 2022-12-02 NOTE — Progress Notes (Unsigned)
      [facility] Counselor/Therapist Progress Note  Patient ID: Aaron Fowler, MRN: [id number],    Date: 12/02/2022  Time Spent: 11:02a - 12:12p  Treatment Type: Individual Therapy  Reported Symptoms: sadness, worries, stress, restlessness, interpersonal concerns   Mental Status Exam:  Appearance:   Neat     Behavior:  Appropriate and Sharing  Motor:  Normal  Speech/Language:   Clear and Coherent and Normal Rate  Affect:  Appropriate and Congruent  Mood:  normal  Thought process:  normal  Thought content:    WNL  Sensory/Perceptual disturbances:    WNL  Orientation:  oriented to person, place, and situation  Attention:  Good  Concentration:  Good  Memory:  WNL  Fund of knowledge:   Good  Insight:    Good  Judgment:   Good  Impulse Control:  Good   Risk Assessment: Danger to Self:  No Self-injurious Behavior: No Danger to Others: No Duty to Warn:no Physical Aggression / Violence:No  Access to Firearms a concern: No  Gang Involvement:No   Subjective: Pt reported improved mood since last session. 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 Counselor voiced concern for pt as with last session bruise; pt identified frustration with Prednisone, and health and pain conditions overall for which medication with side effects is necessitated. Counselor affirmed pt. Counselor facilitated MI around role of alcohol in pt coping per episode two weeks ago. Pt voiced low distress tolerance as driver for relief per alcohol use, which he identified as intermittent and not consistent. He and counselor discussed themes of letting go, acceptance, self worth, clarity, and intrapersonal freedom. Counselor helped facilitate insight and process work for pt.   Interventions: Motivational Interviewing, Solution-Oriented/Positive  Psychology, Humanistic/Existential, and Insight-Oriented  Diagnosis:   ICD-10-CM   1. Major depressive disorder, recurrent episode, moderate (HCC)  F33.1     2. Unspecified mood (affective) disorder (HCC)  F39     3. Generalized anxiety disorder  F41.1       Plan: Pt is scheduled for a follow-up; continue process work and developing coping skills.   Gaspar Bidding, [facility]

## 2022-12-09 ENCOUNTER — Ambulatory Visit: Payer: BC Managed Care – PPO | Admitting: Professional Counselor

## 2022-12-09 ENCOUNTER — Encounter: Payer: Self-pay | Admitting: Professional Counselor

## 2022-12-09 DIAGNOSIS — F39 Unspecified mood [affective] disorder: Secondary | ICD-10-CM | POA: Diagnosis not present

## 2022-12-09 DIAGNOSIS — F331 Major depressive disorder, recurrent, moderate: Secondary | ICD-10-CM

## 2022-12-09 DIAGNOSIS — F411 Generalized anxiety disorder: Secondary | ICD-10-CM

## 2022-12-09 NOTE — Progress Notes (Signed)
      [facility] Counselor/Therapist Progress Note  Patient ID: Aaron Fowler, MRN: [id number],    Date: 12/09/2022  Time Spent: 9:10a - 10:26a   Treatment Type: Individual Therapy  Reported Symptoms: low mood, worries, low self esteem, emotional dysregulation, interpersonal concerns   Mental Status Exam:  Appearance:   Neat     Behavior:  Appropriate and Sharing  Motor:  Normal  Speech/Language:   Clear and Coherent and Normal Rate  Affect:  Appropriate, Blunt, and Depressed  Mood:  depressed  Thought process:  normal  Thought content:    WNL  Sensory/Perceptual disturbances:    WNL  Orientation:  Sound  Attention:  Good  Concentration:  Good  Memory:  WNL  Fund of knowledge:   Good  Insight:    Good  Judgment:   Good  Impulse Control:  Good   Risk Assessment: Danger to Self:  No Self-injurious Behavior: No Danger to Others: No Duty to Warn:no Physical Aggression / Violence:No  Access to Firearms a concern: No  Gang Involvement:No   Subjective: Pt presented to session in marked low spirits and voicing marital conflict as origin of concerns. Pt identified triggers and how he responded and coped; he identified working to limit dysregulation however experiencing suppressed emotions as a result. Counselor affirmed pt successful efforts to deescalate, and assisted pt in inner resouricng to amplify helpful actions and choices in the matter. Counselor and pt discussed pt healthy choices in the moment, and as regards impulse to drink to numb, and discussed alternate adaptive strategies. They discussed interrupting the cycle before tensions begin to rise, and pt reframing thought patterns to not surrender to thoughts of self loathing. Counselor helped to instil hope, and encouraged pt to consider additional self care startgey of a "third place" outside of home and work life where he has a healthy outlet. Counselor and pt discussed options to this effect.   Interventions:  Solution-Oriented/Positive Psychology, Humanistic/Existential, and Insight-Oriented  Diagnosis:   ICD-10-CM   1. Major depressive disorder, recurrent episode, moderate (HCC)  F33.1     2. Unspecified mood (affective) disorder (HCC)  F39     3. Generalized anxiety disorder  F41.1       Plan: Pt is scheduled for a follow-up; continue process work and developing coping skills.   Gaspar Bidding, [facility], [place]

## 2022-12-16 ENCOUNTER — Ambulatory Visit (INDEPENDENT_AMBULATORY_CARE_PROVIDER_SITE_OTHER): Payer: BC Managed Care – PPO | Admitting: Professional Counselor

## 2022-12-16 ENCOUNTER — Ambulatory Visit: Payer: BC Managed Care – PPO | Admitting: Professional Counselor

## 2022-12-16 ENCOUNTER — Encounter: Payer: Self-pay | Admitting: Professional Counselor

## 2022-12-16 DIAGNOSIS — F411 Generalized anxiety disorder: Secondary | ICD-10-CM | POA: Diagnosis not present

## 2022-12-16 DIAGNOSIS — F331 Major depressive disorder, recurrent, moderate: Secondary | ICD-10-CM

## 2022-12-16 DIAGNOSIS — F39 Unspecified mood [affective] disorder: Secondary | ICD-10-CM

## 2022-12-16 NOTE — Progress Notes (Signed)
      [facility] Counselor/Therapist Progress Note  Patient ID: Aaron Fowler, MRN: [id number],    Date: 12/16/2022  Time Spent: 4:05p - 5:11p   Treatment Type: Individual Therapy  Reported Symptoms: restlessness, emotional dysregulation, low mood, low self esteem, mood swings, stress, interpersonal concerns  Mental Status Exam:  Appearance:   Neat     Behavior:  Appropriate and Sharing  Motor:  Normal  Speech/Language:   Clear and Coherent and Normal Rate  Affect:  Appropriate and Congruent  Mood:  normal  Thought process:  normal  Thought content:    WNL  Sensory/Perceptual disturbances:    WNL  Orientation:  Sound  Attention:  Good  Concentration:  Good  Memory:  WNL  Fund of knowledge:   Good  Insight:    Good  Judgment:   Good  Impulse Control:  Good   Risk Assessment: Danger to Self:  No Self-injurious Behavior: No Danger to Others: No Duty to Warn:no Physical Aggression / Violence:No  Access to Firearms a concern: No  Gang Involvement:No   Subjective: Pt processed experience of marital conflict since last session, and how he responded to escalation and how he coped with his emotions afterward. He reflected on how he and spouse communicated truths to one another and reconciled. Pt identified coping skills and strategies he utilized: removing himself from situation upon noticing dysregulation, distracting himself, grounding, and apologizing for his part. Counselor actively listened and affirmed pt feelings, experience and coping skills utilization. Counselor and pt discussed pt tendency to pursue intense emotional experiences, and the healthiness to a middle path with alternate sense of gratification. Pt shared regarding recent use of alcohol to alleviate stress, and how he has since made a commitment to not acquire or drinking higher content alcohol drinks due to impact on well-being and marital dynamics. Counselor affirmed pt proactive stance in this regard.  Counselor and pt discussed reframing of pt negative self view, and counselor worked with pt to consider alternatives, such as seeing strengths in what he defaults to conceptualizing in himself as weakness.   Interventions: Solution-Oriented/Positive Psychology, Humanistic/Existential, Insight-Oriented, and CBT, Anger Management   Diagnosis:   ICD-10-CM   1. Major depressive disorder, recurrent episode, moderate (HCC)  F33.1     2. Unspecified mood (affective) disorder (HCC)  F39     3. Generalized anxiety disorder  F41.1       Plan: Pt is scheduled for a follow-up; continue process work and developing coping skills.  Gaspar Bidding, [facility]

## 2022-12-16 NOTE — Progress Notes (Signed)
Pt did not show for scheduled appt, no message.  Bartolo Darter, [facility]

## 2022-12-23 ENCOUNTER — Ambulatory Visit: Payer: BC Managed Care – PPO | Admitting: Professional Counselor

## 2022-12-30 ENCOUNTER — Encounter: Payer: Self-pay | Admitting: Professional Counselor

## 2022-12-30 ENCOUNTER — Ambulatory Visit: Payer: BC Managed Care – PPO | Admitting: Professional Counselor

## 2022-12-30 DIAGNOSIS — F411 Generalized anxiety disorder: Secondary | ICD-10-CM

## 2022-12-30 DIAGNOSIS — F331 Major depressive disorder, recurrent, moderate: Secondary | ICD-10-CM

## 2022-12-30 DIAGNOSIS — F39 Unspecified mood [affective] disorder: Secondary | ICD-10-CM | POA: Diagnosis not present

## 2022-12-30 NOTE — Progress Notes (Unsigned)
      [facility] Counselor/Therapist Progress Note  Patient ID: STANLY MCNELIS, MRN: [id number],    Date: 12/30/2022  Time Spent: ***   Treatment Type: Individual Therapy  Reported Symptoms: ***  Mental Status Exam:  Appearance:   Neat     Behavior:  Appropriate, Sharing, and Motivated  Motor:  Normal  Speech/Language:   Clear and Coherent and Normal Rate  Affect:  Appropriate and Congruent  Mood:  normal  Thought process:  normal  Thought content:    WNL  Sensory/Perceptual disturbances:    WNL  Orientation:  Sound  Attention:  Good  Concentration:  Good  Memory:  WNL  Fund of knowledge:   Good  Insight:    Good  Judgment:   Good  Impulse Control:  Good   Risk Assessment: Danger to Self:  No Self-injurious Behavior: No Danger to Others: No Duty to Warn:no Physical Aggression / Violence:No  Access to Firearms a concern: No  Gang Involvement:No   Subjective: ***   Interventions: Solution-Oriented/Positive Psychology, Humanistic/Existential, and Insight-Oriented, Anger Management   Diagnosis:   ICD-10-CM   1. Major depressive disorder, recurrent episode, moderate (HCC)  F33.1     2. Unspecified mood (affective) disorder (HCC)  F39     3. Generalized anxiety disorder  F41.1       Plan: ***  Gaspar Bidding, [facility]

## 2023-01-11 ENCOUNTER — Other Ambulatory Visit: Payer: Self-pay | Admitting: Behavioral Health

## 2023-01-11 DIAGNOSIS — F39 Unspecified mood [affective] disorder: Secondary | ICD-10-CM

## 2023-01-11 DIAGNOSIS — R454 Irritability and anger: Secondary | ICD-10-CM

## 2023-01-11 DIAGNOSIS — F411 Generalized anxiety disorder: Secondary | ICD-10-CM

## 2023-01-11 DIAGNOSIS — F331 Major depressive disorder, recurrent, moderate: Secondary | ICD-10-CM

## 2023-01-11 NOTE — Telephone Encounter (Signed)
Pt called back confirmed that he is not taking Zyprexa

## 2023-01-11 NOTE — Telephone Encounter (Signed)
Called pt to clarify if he is taking Zyrexa. LV 09/15 it was taken off his med list ( pt preference).

## 2023-01-13 ENCOUNTER — Encounter: Payer: Self-pay | Admitting: Professional Counselor

## 2023-01-13 ENCOUNTER — Ambulatory Visit: Payer: BC Managed Care – PPO | Admitting: Professional Counselor

## 2023-01-13 DIAGNOSIS — F411 Generalized anxiety disorder: Secondary | ICD-10-CM

## 2023-01-13 DIAGNOSIS — F39 Unspecified mood [affective] disorder: Secondary | ICD-10-CM

## 2023-01-13 DIAGNOSIS — F332 Major depressive disorder, recurrent severe without psychotic features: Secondary | ICD-10-CM | POA: Diagnosis not present

## 2023-01-13 NOTE — Progress Notes (Signed)
      [facility] Counselor/Therapist Progress Note  Patient ID: OSBORN HOGLEN, MRN: [id number],    Date: 01/13/2023  Time Spent: 10:15 a - 11:13a   Treatment Type: Individual Therapy  Reported Symptoms: sadness, sense of hopelessness, restlessness, agitation, worries, low self esteem, vague SI, catastrophic thinking, irritability, emotional numbness, mood swings, emotional dysregulation (anger), interpersonal conflict  Mental Status Exam:  Appearance:   Neat (see below)  Behavior:  Appropriate and Sharing  Motor:  Normal  Speech/Language:   Clear and Coherent and Normal Rate  Affect:  Depressed and Tearful  Mood:  depressed and sad  Thought process:  normal  Thought content:    WNL  Sensory/Perceptual disturbances:    WNL  Orientation:  Sound  Attention:  Good  Concentration:  Good  Memory:  WNL  Fund of knowledge:   Good  Insight:    Good  Judgment:   Fair  Impulse Control:  Fair   Risk Assessment: Danger to Self:  Yes.  without intent/plan vague SI Self-injurious Behavior: Yes.  without intent/plan (see below) Danger to Others: No Duty to Warn:no Physical Aggression / Violence:Yes toward property Access to Firearms a concern: No  Gang Involvement:No   Subjective: Pt presented to session to address concerns of anger, depression and anxiety per treatment plan goals; he reported no progress at this time. Pt reported to be in state of acute depression with features of emotional overwhelm and numbing alike, self loathing, and voiced thoughts of giving up on trying to feel better or hope for change in circumstances of concern (marital relationship). Pt voiced intention not to return to therapy since he has mainly sought help to improve marital relations; counselor encouraged pt to return to work on therapeutic goals for support and in pursuit of intrapersonal stabilization, coping, healing and growth, which pt agreed to, with less frequency. Counselor continued to encourage  couples counseling for pt and wife. They discussed pt progress in treatment thus far, of his readiness and motivation, and changes he's already made in capacity to self soothe, regulate, deescalate, reflect, identify and process feelings, and communicate more effectively. Pt presented to session with a black eye, which he explained was from an episode of emotional dysregulation that resulted in him throwing an object (in a room alone) and thereby hurting himself; he endorsed complete absence of violence between he, spouse or any other members of family. Counselor voiced overall worry for pt, and continued with safety planning, and worked to instil hope and to encourage ongoing treatment.   Interventions: Solution-Oriented/Positive Psychology, Humanistic/Existential, Insight-Oriented, and Anger Management   Diagnosis:   ICD-10-CM   1. Severe episode of recurrent major depressive disorder, without psychotic features (HCC)  F33.2     2. Unspecified mood (affective) disorder (HCC)  F39     3. Generalized anxiety disorder  F41.1       Plan: Pt encouraged to return for follow-up; continue safety planning, process work, and developing coping skills including anger management program. Pt to resource coping skills between sessions and to schedule work-in appointment as needed, and observe safety plan protocol as needed.  Gaspar Bidding, [facility]

## 2023-01-27 ENCOUNTER — Ambulatory Visit: Payer: BC Managed Care – PPO | Admitting: Professional Counselor

## 2023-02-10 ENCOUNTER — Ambulatory Visit: Payer: BC Managed Care – PPO | Admitting: Professional Counselor

## 2023-02-17 ENCOUNTER — Encounter: Payer: Self-pay | Admitting: Professional Counselor

## 2023-02-17 ENCOUNTER — Other Ambulatory Visit: Payer: Self-pay | Admitting: Behavioral Health

## 2023-02-17 ENCOUNTER — Ambulatory Visit: Payer: BC Managed Care – PPO | Admitting: Professional Counselor

## 2023-02-17 DIAGNOSIS — F331 Major depressive disorder, recurrent, moderate: Secondary | ICD-10-CM | POA: Diagnosis not present

## 2023-02-17 DIAGNOSIS — F411 Generalized anxiety disorder: Secondary | ICD-10-CM

## 2023-02-17 DIAGNOSIS — F39 Unspecified mood [affective] disorder: Secondary | ICD-10-CM

## 2023-02-17 DIAGNOSIS — R454 Irritability and anger: Secondary | ICD-10-CM

## 2023-02-17 NOTE — Progress Notes (Signed)
°      [facility] Counselor/Therapist Progress Note  Patient ID: Aaron Fowler, MRN: [id number],    Date: 03/05/2023  Time Spent: 10:04 AM to 11:15 AM  Treatment Type: Individual Therapy  Reported Symptoms: worries, sadness, emotional dysregulation, mood swings, phase of life concerns, interpersonal concerns, intimacy concerns   Mental Status Exam:  Appearance:   Neat     Behavior:  Appropriate, Sharing, and Motivated  Motor:  Normal  Speech/Language:   Clear and Coherent and Normal Rate  Affect:  Appropriate and Congruent  Mood:  normal  Thought process:  normal  Thought content:    WNL  Sensory/Perceptual disturbances:    WNL  Orientation:  Sound  Attention:  Good  Concentration:  Good  Memory:  WNL  Fund of knowledge:   Good  Insight:    Good  Judgment:   Good  Impulse Control:  Good   Risk Assessment: Danger to Self:  No Self-injurious Behavior: No Danger to Others: No Duty to Warn:no Physical Aggression / Violence:No  Access to Firearms a concern: No  Gang Involvement:No   Subjective: Patient presented to session to address concerns of anxiety, depression and anger management per mood swings.  He reported mixed progress at this time.  Patient reflected on how he was feeling upon last session, when he was "shut down" and felt that the episode lasted for 2 to 3 weeks, but that he then "reset."  Patient voiced feeling better upon this session.  He reported not using alcohol at this time, and having discontinued his meds abruptly and altogether, as self-directed.  He voiced feeling that the medication was not helping.  Counselor encouraged follow-up with prescribing provider.  Patient processed experience of marital conflict and intimacy issues.  Counselor assisted with psychoeducation, active listening, facilitation of insight, and affirmation of patient feelings and experience.  Patient and counselor worked to develop strategies for improved communication and relational  style changes to help improve marital dynamics.  Patient identified coping skills of distraction, walking away, self soothing, working to attune with spouse, encouraging his own positive self talk and sense of humor in life.  Counselor reinforced coping skills and encouraged patient proactive strategies.  Patient identified short-term goal of continuing positive trajectory per these strategies.    Interventions: Solution-Oriented/Positive Psychology, Humanistic/Existential, and Insight-Oriented  Diagnosis:   ICD-10-CM   1. Generalized anxiety disorder  F41.1     2. Major depressive disorder, recurrent episode, moderate (HCC)  F33.1     3. Unspecified mood (affective) disorder (HCC)  F39       Plan: Patient is scheduled for follow-up; continue process work and developing coping skills.  Patient to work on short-term goal between sessions of continuing to work on Engineer, agricultural and emotional regulation techniques.  Progress note was dictated with Dragon and reviewed for accuracy.  Gaspar Bidding, [facility]

## 2023-03-03 ENCOUNTER — Ambulatory Visit (INDEPENDENT_AMBULATORY_CARE_PROVIDER_SITE_OTHER): Payer: BC Managed Care – PPO | Admitting: Professional Counselor

## 2023-03-10 ENCOUNTER — Telehealth: Payer: Self-pay | Admitting: Behavioral Health

## 2023-03-10 NOTE — Telephone Encounter (Signed)
 Howell called at 10:52 to request refill of his Zolpidem. Pharmacy told him they had a request and not heard back.  I don't see one. Anyway he needs a refill.  Appt 1/10.  Send to  [id number] - [place], [place] - [address]

## 2023-03-10 NOTE — Telephone Encounter (Signed)
 I show patient should have a RF. Called pharmacy and he does have a RF. I asked them to fill it today, copay of $12.53. Patient notified.

## 2023-03-17 ENCOUNTER — Ambulatory Visit: Payer: BC Managed Care – PPO | Admitting: Behavioral Health

## 2023-03-31 ENCOUNTER — Ambulatory Visit (INDEPENDENT_AMBULATORY_CARE_PROVIDER_SITE_OTHER): Payer: BC Managed Care – PPO | Admitting: Professional Counselor

## 2023-04-14 ENCOUNTER — Ambulatory Visit: Payer: BC Managed Care – PPO | Admitting: Professional Counselor

## 2023-04-28 ENCOUNTER — Ambulatory Visit: Payer: BC Managed Care – PPO | Admitting: Professional Counselor

## 2023-06-28 ENCOUNTER — Encounter (HOSPITAL_BASED_OUTPATIENT_CLINIC_OR_DEPARTMENT_OTHER): Payer: Self-pay | Admitting: Student

## 2023-06-28 ENCOUNTER — Ambulatory Visit (INDEPENDENT_AMBULATORY_CARE_PROVIDER_SITE_OTHER): Payer: Self-pay | Admitting: Student

## 2023-06-28 VITALS — BP 116/79 | HR 87 | Temp 98.2°F | Resp 16 | Ht 71.06 in | Wt 200.8 lb

## 2023-06-28 DIAGNOSIS — Z7689 Persons encountering health services in other specified circumstances: Secondary | ICD-10-CM

## 2023-06-28 DIAGNOSIS — Z131 Encounter for screening for diabetes mellitus: Secondary | ICD-10-CM

## 2023-06-28 DIAGNOSIS — M2550 Pain in unspecified joint: Secondary | ICD-10-CM | POA: Diagnosis not present

## 2023-06-28 DIAGNOSIS — F332 Major depressive disorder, recurrent severe without psychotic features: Secondary | ICD-10-CM | POA: Diagnosis not present

## 2023-06-28 DIAGNOSIS — L408 Other psoriasis: Secondary | ICD-10-CM | POA: Diagnosis not present

## 2023-06-28 DIAGNOSIS — F411 Generalized anxiety disorder: Secondary | ICD-10-CM

## 2023-06-28 DIAGNOSIS — Z13220 Encounter for screening for lipoid disorders: Secondary | ICD-10-CM

## 2023-06-28 DIAGNOSIS — Z136 Encounter for screening for cardiovascular disorders: Secondary | ICD-10-CM | POA: Diagnosis not present

## 2023-06-28 DIAGNOSIS — Z7952 Long term (current) use of systemic steroids: Secondary | ICD-10-CM

## 2023-06-28 HISTORY — DX: Generalized anxiety disorder: F41.1

## 2023-06-28 HISTORY — DX: Major depressive disorder, recurrent severe without psychotic features: F33.2

## 2023-06-28 MED ORDER — CLONAZEPAM 0.5 MG PO TABS: 0.5000 mg | ORAL_TABLET | Freq: Every day | ORAL | 0 refills | Status: DC

## 2023-06-28 NOTE — Assessment & Plan Note (Addendum)
 Recurrent, severe major depressive disorder with active symptoms including occasional suicidal ideation (without a plan), emotional distress, and functional impairment. Symptoms have worsened on lamotrigine , leading to discontinuation. Previous treatment with lamotrigine , Trintellix , and olanzapine  was ineffective. Current symptoms include daily crying, lack of motivation, and inability to function at work and home. Protective factors include family support from wife and children. No current plan for self-harm, but significant distress is present. Immediate psychiatric intervention is necessary. - Prescribe clonazepam  0.5 mg, 30 tablets, to be taken once daily as needed for anxiety and depression until psychiatric follow-up is established. - Instruct him to contact [facility] for psychiatric appointment as soon as possible. - Encourage re-engagement with counseling services through [facility]. - Schedule follow-up in one month if psychiatric appointment is not secured.

## 2023-06-28 NOTE — Patient Instructions (Addendum)
 It was nice to see you today!  As we discussed in clinic:  - Please call psych immediately to reestablish with them and discuss med changes.  I am bridging you over to your psychiatric provider with klonopin , this may make you very tired. Please do not drive while taking this medication.  If you have any problems before your next visit feel free to message me via MyChart (minor issues or questions) or call the office, otherwise you may reach out to schedule an office visit.  Thank you! Joane Postel, PA-C

## 2023-06-28 NOTE — Assessment & Plan Note (Signed)
 Recurrent, severe major depressive disorder with active symptoms including occasional suicidal ideation (without a plan), emotional distress, and functional impairment. Symptoms have worsened on lamotrigine , leading to discontinuation. Previous treatment with lamotrigine , Trintellix , and olanzapine  was ineffective. Current symptoms include daily crying, lack of motivation, and inability to function at work and home. Protective factors include family support from wife and children. No current plan for self-harm, but significant distress is present. Immediate psychiatric intervention is necessary. - Prescribe clonazepam  0.5 mg, 30 tablets, to be taken once daily as needed for anxiety and depression until psychiatric follow-up is established. - Instruct him to contact [facility] for psychiatric appointment as soon as possible. - Encourage re-engagement with counseling services through [facility]. - Schedule follow-up in one month if psychiatric appointment is not secured.

## 2023-06-28 NOTE — Assessment & Plan Note (Signed)
 Prednisone  is necessary for management of underlying inflammatory condition. Discontinuation is not advised due to risk of adrenal crisis. - Continue current prednisone  regimen.

## 2023-06-28 NOTE — Assessment & Plan Note (Signed)
 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 Pain is present but not currently under orthopedic care due to financial constraints. Last orthopedic evaluation was in 2022. - Encourage follow-up with orthopedic provider when feasible.

## 2023-06-28 NOTE — Assessment & Plan Note (Signed)
 Psoriasis with associated systemic inflammatory symptoms. Current management includes prednisone , which provides symptomatic relief. - Continue current prednisone  regimen.

## 2023-06-28 NOTE — Progress Notes (Signed)
 New Patient Office Visit  Subjective    Patient ID: Aaron Fowler, male    DOB: 1970/09/28  Age: 53 y.o. MRN: [id number]  CC:  Chief Complaint  Patient presents with   Establish Care    Here to establish care. 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    Anxiety    Has been going through anxiety and depression. Was seeing psychiatrist and stopped seeing them around 8-9 months ago.    Pain    Been having pain on neck on C3-C7 and both shoulders. Had MRI's done with [facility]. Hands cramp up a lot.    Discussed the use of AI scribe software for clinical note transcription with the patient, who gave verbal consent to proceed.  History of Present Illness   Aaron Fowler is a 53 year old male who presents with mental health concerns, particularly depression and anxiety.  He has a history of psychiatric treatment at [facility] and was previously on medications such as Lamictal , Trintellix , and olanzapine . He discontinued Lamictal  due to worsening suicidal thoughts. He experiences daily crying, feelings of being overwhelmed, and difficulty functioning at work and home. He has a history of anxiety attacks and uses clonazepam  for emergencies, which induces sleep.  He has a complex medical history involving rheumatology issues, initially triggered by psoriasis. Approximately 20 years ago, he experienced significant weight loss, fatigue, and other symptoms leading to extensive testing without a definitive diagnosis. He has been on prednisone  for many years, which helps him function. Attempts to wean off prednisone  have been unsuccessful. He reports systemic inflammatory symptoms, including mouth inflammation, 'blind spots in my eyes,' headaches, and rib pain. He describes periods of feeling cold, having no energy, and experiencing flare-ups that 'come and go.'  He reports neck pain due to arthritis and bone spurs in both AC joints and his hip, though he has not had x-rays for  the hip. He has seen orthopedic specialists in the past but has not had recent follow-up due to financial constraints. He mentions that his hip is 'shot' and describes his pain level as significant.  He has a history of kidney issues, having been in kidney failure previously, which was treated with fluids. He reports abdominal pain that was severe enough to be mistaken for kidney stones.  He vapes nicotine and has lost 270 pounds, which has improved his leg swelling. He has striae on his abdomen, likely related to prednisone  use and previous weight gain. 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 No chest pain or palpitations.         06/28/2023    3:16 PM 09/30/2022   11:38 AM  GAD 7 : Generalized Anxiety Score  Nervous, Anxious, on Edge 3 3  Control/stop worrying 3 3  Worry too much - different things 3 3  Trouble relaxing 3 2  Restless 0 0  Easily annoyed or irritable 3 3  Afraid - awful might happen 0 2  Total GAD 7 Score 15 16  Anxiety Difficulty Somewhat difficult Very difficult      06/28/2023    3:16 PM 09/30/2022   11:36 AM 07/28/2022    5:08 PM  PHQ9 SCORE ONLY  PHQ-9 Total Score 24 16 20    Screenings:  Colon Cancer: Colonoscopy 2023- no polyps Lung Cancer: vapes nicotine Diabetes: indicated HLD: indicated   Outpatient Encounter Medications as of 06/28/2023  Medication Sig   clonazePAM  (KLONOPIN ) 0.5 MG tablet  Take 1 tablet (0.5 mg total) by mouth daily.   omeprazole (PRILOSEC) 20 MG capsule Take 20 mg by mouth daily.   predniSONE  (DELTASONE ) 5 MG tablet Take 5 mg by mouth daily.   tadalafil (CIALIS) 5 MG tablet Take 5 mg by mouth daily.   lamoTRIgine  (LAMICTAL ) 100 MG tablet TAKE 1 TABLET BY MOUTH EVERY DAY (Patient not taking: Reported on 06/28/2023)   lamoTRIgine  (LAMICTAL ) 25 MG tablet TAKE 1/2 TABLET BY MOUTH FOR 14 DAYS, THEN TAKE TWO TABLETS 50 MG TOTAL DAILY. (Patient not taking: Reported on 06/28/2023)   meloxicam (MOBIC) 15 MG  tablet Take 15 mg by mouth daily. (Patient not taking: Reported on 06/28/2023)   tiZANidine (ZANAFLEX) 4 MG tablet Take 4 mg by mouth 3 (three) times daily. (Patient not taking: Reported on 06/28/2023)   TRINTELLIX  20 MG TABS tablet Take 1 tablet (20 mg total) by mouth daily. (Patient not taking: Reported on 06/28/2023)   zolpidem  (AMBIEN ) 10 MG tablet Take 1 tablet (10 mg total) by mouth at bedtime. (Patient not taking: Reported on 06/28/2023)   No facility-administered encounter medications on file as of 06/28/2023.    Past Medical History:  Diagnosis Date   Anxiety    Depression    GERD (gastroesophageal reflux disease)    Insomnia    Psoriasis    per patient    Psoriatic arthritis (HCC)     Past Surgical History:  Procedure Laterality Date   ANAL FISSURE REPAIR     BONE MARROW BIOPSY     CATARACT EXTRACTION, BILATERAL     10/28/2019, 12/16/2019    CYST EXCISION     on buttock    LYMPH NODE BIOPSY      Family History  Problem Relation Age of Onset   Heart Problems Mother    Alcohol abuse Father    Prostate cancer Father        anget orange   Autoimmune disease Sister    Dementia Maternal Grandmother     Social History   Socioeconomic History   Marital status: Married    Spouse name: Conway Dennis   Number of children: 4   Years of education: 12   Highest education level: GED or equivalent  Occupational History   Occupation: Zone Occupational hygienist    Comment: [employer]  Tobacco Use   Smoking status: Never    Passive exposure: Never   Smokeless tobacco: Never  Vaping Use   Vaping status: Every Day   Substances: Nicotine  Substance and Sexual Activity   Alcohol use: Yes    Comment: light social drinker, will drink to intoxication occasionally   Drug use: Not Currently    Types: Marijuana, Other-see comments    Comment: mushrooms   Sexual activity: Yes  Other Topics Concern   Not on file  Social History Narrative   2 adopted children    Social Drivers of Health    Financial Resource Strain: Not on file  Food Insecurity: No Food Insecurity (06/28/2023)   Hunger Vital Sign    Worried About Running Out of Food in the Last Year: Never true    Ran Out of Food in the Last Year: Never true  Transportation Needs: No Transportation Needs (06/28/2023)   PRAPARE - Administrator, Civil Service (Medical): No    Lack of Transportation (Non-Medical): No  Physical Activity: Not on file  Stress: Not on file  Social Connections: Not on file  Intimate Partner Violence: Not At Risk (06/28/2023)  Humiliation, Afraid, Rape, and Kick questionnaire    Fear of Current or Ex-Partner: No    Emotionally Abused: No    Physically Abused: No    Sexually Abused: No    ROS  Per HPI      Objective    BP 116/79   Pulse 87   Temp 98.2 F (36.8 C) (Oral)   Resp 16   Ht 5' 11.06" (1.805 m)   Wt 200 lb 12.8 oz (91.1 kg)   SpO2 97%   BMI 27.96 kg/m   Physical Exam Constitutional:      General: He is not in acute distress.    Appearance: Normal appearance. He is not ill-appearing.  HENT:     Head: Normocephalic and atraumatic.     Right Ear: External ear normal.     Left Ear: External ear normal.     Nose: Nose normal.     Mouth/Throat:     Mouth: Mucous membranes are moist.     Pharynx: Oropharynx is clear.  Eyes:     General: No scleral icterus.    Extraocular Movements: Extraocular movements intact.     Conjunctiva/sclera: Conjunctivae normal.     Pupils: Pupils are equal, round, and reactive to light.  Neck:     Vascular: No carotid bruit.  Cardiovascular:     Rate and Rhythm: Normal rate and regular rhythm.     Pulses: Normal pulses.     Heart sounds: Normal heart sounds. No murmur heard.    No friction rub.  Pulmonary:     Effort: Pulmonary effort is normal. No respiratory distress.     Breath sounds: Normal breath sounds. No wheezing, rhonchi or rales.  Abdominal:     Comments: Striae noted across abdomen  Musculoskeletal:         General: Normal range of motion.     Cervical back: Neck supple.  Skin:    General: Skin is warm and dry.     Coloration: Skin is not jaundiced or pale.  Neurological:     General: No focal deficit present.     Mental Status: He is alert.  Psychiatric:        Mood and Affect: Mood normal.        Behavior: Behavior normal.         Assessment & Plan:   Encounter to establish care  Severe episode of recurrent major depressive disorder, without psychotic features (HCC) Assessment & Plan: Recurrent, severe major depressive disorder with active symptoms including suicidal ideation, emotional distress, and functional impairment. Symptoms have worsened on lamotrigine , leading to discontinuation. Previous treatment with lamotrigine , Trintellix , and olanzapine  was ineffective. Current symptoms include daily crying, lack of motivation, and inability to function at work and home. Protective factors include family support from wife and children. No current plan for self-harm, but significant distress is present. Immediate psychiatric intervention is necessary. - Prescribe clonazepam  0.5 mg, 30 tablets, to be taken once daily as needed for anxiety and depression until psychiatric follow-up is established. - Instruct him to contact [facility] for psychiatric appointment as soon as possible. - Encourage re-engagement with counseling services through [facility]. - Schedule follow-up in one month if psychiatric appointment is not secured.  Orders: -     clonazePAM ; Take 1 tablet (0.5 mg total) by mouth daily.  Dispense: 30 tablet; Refill: 0  Long term current use of systemic steroids Assessment & Plan:  Prednisone  is necessary for management of underlying inflammatory condition. Discontinuation is not advised  due to risk of adrenal crisis. - Continue current prednisone  regimen.   Polyarthralgia Assessment & Plan: Chronic arthritis and bone spurs affecting neck, AC joints, and hip. Pain is  present but not currently under orthopedic care due to financial constraints. Last orthopedic evaluation was in 2022. - Encourage follow-up with orthopedic provider when feasible.    GAD (generalized anxiety disorder) -     clonazePAM ; Take 1 tablet (0.5 mg total) by mouth daily.  Dispense: 30 tablet; Refill: 0 -     CBC with Differential/Platelet -     Comprehensive metabolic panel with GFR -     TSH  Encounter for lipid screening for cardiovascular disease -     Lipid panel  Screening for diabetes mellitus -     Hemoglobin A1c  Other psoriasis Assessment & Plan: Psoriasis with associated systemic inflammatory symptoms. Current management includes prednisone , which provides symptomatic relief. - Continue current prednisone  regimen.   Return in about 3 months (around 09/27/2023) for Annual Physical.   Daniele Dillow T Kemper Hochman, PA-C

## 2023-06-29 LAB — CBC WITH DIFFERENTIAL/PLATELET
Basophils Absolute: 0 10*3/uL (ref 0.0–0.2)
Basos: 1 %
EOS (ABSOLUTE): 0 10*3/uL (ref 0.0–0.4)
Eos: 0 %
Hematocrit: 37 % — ABNORMAL LOW (ref 37.5–51.0)
Hemoglobin: 12.1 g/dL — ABNORMAL LOW (ref 13.0–17.7)
Immature Grans (Abs): 0 10*3/uL (ref 0.0–0.1)
Immature Granulocytes: 0 %
Lymphocytes Absolute: 1.1 10*3/uL (ref 0.7–3.1)
Lymphs: 19 %
MCH: 29.5 pg (ref 26.6–33.0)
MCHC: 32.7 g/dL (ref 31.5–35.7)
MCV: 90 fL (ref 79–97)
Monocytes Absolute: 0.4 10*3/uL (ref 0.1–0.9)
Monocytes: 7 %
Neutrophils Absolute: 4.2 10*3/uL (ref 1.4–7.0)
Neutrophils: 73 %
Platelets: 208 10*3/uL (ref 150–450)
RBC: 4.1 x10E6/uL — ABNORMAL LOW (ref 4.14–5.80)
RDW: 12.9 % (ref 11.6–15.4)
WBC: 5.8 10*3/uL (ref 3.4–10.8)

## 2023-06-29 LAB — COMPREHENSIVE METABOLIC PANEL WITH GFR
ALT: 8 IU/L (ref 0–44)
AST: 32 IU/L (ref 0–40)
Albumin: 4.3 g/dL (ref 3.8–4.9)
Alkaline Phosphatase: 70 IU/L (ref 44–121)
BUN/Creatinine Ratio: 11 (ref 9–20)
BUN: 16 mg/dL (ref 6–24)
Bilirubin Total: 0.3 mg/dL (ref 0.0–1.2)
CO2: 22 mmol/L (ref 20–29)
Calcium: 8.9 mg/dL (ref 8.7–10.2)
Chloride: 104 mmol/L (ref 96–106)
Creatinine, Ser: 1.44 mg/dL — ABNORMAL HIGH (ref 0.76–1.27)
Globulin, Total: 2.4 g/dL (ref 1.5–4.5)
Glucose: 87 mg/dL (ref 70–99)
Potassium: 4.7 mmol/L (ref 3.5–5.2)
Sodium: 140 mmol/L (ref 134–144)
Total Protein: 6.7 g/dL (ref 6.0–8.5)
eGFR: 58 mL/min/{1.73_m2} — ABNORMAL LOW (ref >59)

## 2023-06-29 LAB — LIPID PANEL
Chol/HDL Ratio: 2.8 ratio (ref 0.0–5.0)
Cholesterol, Total: 190 mg/dL (ref 100–199)
HDL: 68 mg/dL (ref >39)
LDL Chol Calc (NIH): 107 mg/dL — ABNORMAL HIGH (ref 0–99)
Triglycerides: 83 mg/dL (ref 0–149)
VLDL Cholesterol Cal: 15 mg/dL (ref 5–40)

## 2023-06-29 LAB — TSH: TSH: 2.28 u[IU]/mL (ref 0.450–4.500)

## 2023-06-29 LAB — HEMOGLOBIN A1C
Est. average glucose Bld gHb Est-mCnc: 105 mg/dL
Hgb A1c MFr Bld: 5.3 % (ref 4.8–5.6)

## 2023-06-30 ENCOUNTER — Other Ambulatory Visit (HOSPITAL_BASED_OUTPATIENT_CLINIC_OR_DEPARTMENT_OTHER): Payer: Self-pay | Admitting: Student

## 2023-06-30 ENCOUNTER — Encounter (HOSPITAL_BASED_OUTPATIENT_CLINIC_OR_DEPARTMENT_OTHER): Payer: Self-pay | Admitting: Student

## 2023-06-30 DIAGNOSIS — N179 Acute kidney failure, unspecified: Secondary | ICD-10-CM

## 2023-07-07 ENCOUNTER — Other Ambulatory Visit (HOSPITAL_BASED_OUTPATIENT_CLINIC_OR_DEPARTMENT_OTHER)

## 2023-07-07 ENCOUNTER — Other Ambulatory Visit (HOSPITAL_BASED_OUTPATIENT_CLINIC_OR_DEPARTMENT_OTHER): Payer: Self-pay

## 2023-07-07 DIAGNOSIS — N179 Acute kidney failure, unspecified: Secondary | ICD-10-CM | POA: Diagnosis not present

## 2023-07-07 MED ORDER — SHINGRIX 50 MCG/0.5ML IM SUSR
0.5000 mL | INTRAMUSCULAR | 0 refills | Status: DC
  Filled 2023-07-07: qty 1, 30d supply, fill #0

## 2023-07-09 LAB — URINALYSIS, ROUTINE W REFLEX MICROSCOPIC
Bilirubin, UA: NEGATIVE
Glucose, UA: NEGATIVE
Ketones, UA: NEGATIVE
Leukocytes,UA: NEGATIVE
Nitrite, UA: NEGATIVE
Protein,UA: NEGATIVE
RBC, UA: NEGATIVE
Specific Gravity, UA: 1.015 (ref 1.005–1.030)
Urobilinogen, Ur: 0.2 mg/dL (ref 0.2–1.0)
pH, UA: 6.5 (ref 5.0–7.5)

## 2023-07-09 LAB — MICROALBUMIN / CREATININE URINE RATIO
Creatinine, Urine: 148.4 mg/dL
Microalb/Creat Ratio: <2 mg/g{creat} (ref 0–29)
Microalbumin, Urine: <3 ug/mL

## 2023-07-09 LAB — CYSTATIN C: CYSTATIN C: 1.14 mg/L (ref 0.67–1.14)

## 2023-07-09 LAB — BASIC METABOLIC PANEL WITH GFR
BUN/Creatinine Ratio: 11 (ref 9–20)
BUN: 14 mg/dL (ref 6–24)
CO2: 21 mmol/L (ref 20–29)
Calcium: 9.5 mg/dL (ref 8.7–10.2)
Chloride: 103 mmol/L (ref 96–106)
Creatinine, Ser: 1.24 mg/dL (ref 0.76–1.27)
Glucose: 81 mg/dL (ref 70–99)
Potassium: 4.9 mmol/L (ref 3.5–5.2)
Sodium: 140 mmol/L (ref 134–144)
eGFR: 70 mL/min/{1.73_m2} (ref >59)

## 2023-07-10 ENCOUNTER — Encounter (HOSPITAL_BASED_OUTPATIENT_CLINIC_OR_DEPARTMENT_OTHER): Payer: Self-pay | Admitting: Student

## 2023-07-14 ENCOUNTER — Ambulatory Visit: Admitting: Behavioral Health

## 2023-07-14 ENCOUNTER — Encounter: Payer: Self-pay | Admitting: Behavioral Health

## 2023-07-14 DIAGNOSIS — F3130 Bipolar disorder, current episode depressed, mild or moderate severity, unspecified: Secondary | ICD-10-CM

## 2023-07-14 DIAGNOSIS — F411 Generalized anxiety disorder: Secondary | ICD-10-CM

## 2023-07-14 DIAGNOSIS — F331 Major depressive disorder, recurrent, moderate: Secondary | ICD-10-CM

## 2023-07-14 DIAGNOSIS — F5105 Insomnia due to other mental disorder: Secondary | ICD-10-CM

## 2023-07-14 DIAGNOSIS — F99 Mental disorder, not otherwise specified: Secondary | ICD-10-CM

## 2023-07-14 MED ORDER — QUETIAPINE FUMARATE 25 MG PO TABS: ORAL_TABLET | ORAL | 1 refills | Status: DC

## 2023-07-14 MED ORDER — CARIPRAZINE HCL 1.5 MG PO CAPS: 1.5000 mg | ORAL_CAPSULE | Freq: Every day | ORAL | 1 refills | Status: DC

## 2023-07-14 NOTE — Progress Notes (Signed)
 [facility] Med Check  Patient ID: Aaron Fowler,  MRN: [id number]  PCP: Nehemiah Balling  Date of Evaluation: 07/14/2023 Time spent:30 minutes  Chief Complaint:  Chief Complaint   Depression; Anxiety; Follow-up; Medication Refill; Patient Education     HISTORY/CURRENT STATUS: HPI"Aaron Fowler", 53 year old male presents to this office for for follow up and medication management.  He Collateral information should be considered reliable. He appears very distressed and tearful at times. He acknowledge not following up with this office after initial visit. He did not take his medications as prescribed. Says that his wife is frustrated about him not following up. Says that his mood fluctuation have been severe. He is experiencing hyperactivity or mania a couple times per week and then slips back in to dark depression. Say that reflecting back he realized that he has probable suffered from a mood disorder since he was young but just overlooked it. Says that he "can be the life of the party or down in the dumps". He is drinking wine at night to help with sleep. Getting 4-5 hours per night. Falls asleep easily but then wakes up.   Endorsing frequent irritability, trouble concentrating, getting less sleep and not missing, racing thoughts, and poor impulse control.  Says his moods are all over the place and he needs help. Willing to try new medication and says that he commits to being compliant this time.  He rates his depression at 8/10 and his anxiety at 4/10. He endorses periods of hyperactivity followed by severe depression. No current mania. He denies any psychosis or auditory or visual hallucinations.  He verbally contracted for safety with this Clinical research associate.     Past psychiatric medication trials:   Trintellix  Ambien   Individual Medical History/ Review of Systems: Changes? :No   Allergies: Sulfamethoxazole  Current Medications:  Current Outpatient Medications:    cariprazine (VRAYLAR) 1.5  MG capsule, Take 1 capsule (1.5 mg total) by mouth daily., Disp: 30 capsule, Rfl: 1   QUEtiapine (SEROQUEL) 25 MG tablet, Take 1-2 tablet by mouth daily at bedtime for sleep and anxiety, Disp: 60 tablet, Rfl: 1   clonazePAM  (KLONOPIN ) 0.5 MG tablet, Take 1 tablet (0.5 mg total) by mouth daily., Disp: 30 tablet, Rfl: 0   lamoTRIgine  (LAMICTAL ) 100 MG tablet, TAKE 1 TABLET BY MOUTH EVERY DAY (Patient not taking: Reported on 06/28/2023), Disp: 90 tablet, Rfl: 0   lamoTRIgine  (LAMICTAL ) 25 MG tablet, TAKE 1/2 TABLET BY MOUTH FOR 14 DAYS, THEN TAKE TWO TABLETS 50 MG TOTAL DAILY. (Patient not taking: Reported on 06/28/2023), Disp: 180 tablet, Rfl: 1   meloxicam (MOBIC) 15 MG tablet, Take 15 mg by mouth daily. (Patient not taking: Reported on 06/28/2023), Disp: , Rfl:    omeprazole (PRILOSEC) 20 MG capsule, Take 20 mg by mouth daily., Disp: , Rfl:    predniSONE  (DELTASONE ) 5 MG tablet, Take 5 mg by mouth daily., Disp: , Rfl:    tadalafil (CIALIS) 5 MG tablet, Take 5 mg by mouth daily., Disp: , Rfl:    tiZANidine (ZANAFLEX) 4 MG tablet, Take 4 mg by mouth 3 (three) times daily. (Patient not taking: Reported on 06/28/2023), Disp: , Rfl:    TRINTELLIX  20 MG TABS tablet, Take 1 tablet (20 mg total) by mouth daily. (Patient not taking: Reported on 06/28/2023), Disp: 30 tablet, Rfl: 3   zolpidem  (AMBIEN ) 10 MG tablet, Take 1 tablet (10 mg total) by mouth at bedtime. (Patient not taking: Reported on 06/28/2023), Disp: 30 tablet, Rfl: 2   Zoster  Vaccine Adjuvanted (SHINGRIX ) injection, Inject 0.5 mLs into the muscle., Disp: 1 each, Rfl: 0 Medication Side Effects: none  Family Medical/ Social History: Changes? No  MENTAL HEALTH EXAM:  There were no vitals taken for this visit.There is no height or weight on file to calculate BMI.  General Appearance: Casual, Neat, and Well Groomed  Eye Contact:  Good  Speech:  Clear and Coherent  Volume:  Normal  Mood:  NA  Affect:  Appropriate  Thought Process:  Coherent   Orientation:  Full (Time, Place, and Person)  Thought Content: Logical   Suicidal Thoughts:  No  Homicidal Thoughts:  No  Memory:  WNL  Judgement:  Fair  Insight:  Fair  Psychomotor Activity:  Normal  Concentration:  Concentration: Fair  Recall:  Good  Fund of Knowledge: Fair  Language: Good  Assets:  Desire for Improvement Resilience Social Support  ADL's:  Intact  Cognition: WNL  Prognosis:  Good    DIAGNOSES:    ICD-10-CM   1. Bipolar I disorder, most recent episode depressed (HCC)  F31.30 cariprazine (VRAYLAR) 1.5 MG capsule    2. Generalized anxiety disorder  F41.1     3. Major depressive disorder, recurrent episode, moderate (HCC)  F33.1 cariprazine (VRAYLAR) 1.5 MG capsule    4. Insomnia due to other mental disorder  F51.05 QUEtiapine (SEROQUEL) 25 MG tablet   F99       Receiving Psychotherapy: No    RECOMMENDATIONS:  Greater than 50% of 30  min face to face time with patient was spent on counseling and coordination of care. Discussed his report of frequent and rapid mood fluctuation multiple time per month. He is mostly depressed with mania presenting 4-5 times per month. We discussed his stopping of medication without notifying this office. Last visit was in Sept 2024. He did not inform of any problems with medication. He commits to being compliant with medication this visit and to take as prescribed. We had candid discussion that I was prepared to provide diagnosis of Bipolar 1 disorder with most recent depression. This is after further review of detailed hx and self report by patient.  He meets criterion according to [facility].  He is in agreement with this diagnosis.   We agreed today to:  To start Vraylar 1.5 mg daily after breakfast. Two weeks samples provided along with discount drug card.  To start 25 mg to 50 mg Seroquel at bedtime for sleep and anxiety.  To follow up in 4 weeks to reassess Will report worsening symptoms or side effects immediately Provided  emergency contact information to include 911, nearest ER, [facility], or after-hours emergency line. Monitor for any sign of rash. Please taking Lamictal  and contact office immediately rash develops. Recommend seeking urgent medical attention if rash is severe and/or spreading quickly.  Discussed potential metabolic side effects associated with atypical antipsychotics, as well as potential risk for movement side effects. Advised pt to contact office if movement side effects occur.   Reviewed PDMP  Lincoln Renshaw, NP

## 2023-07-28 DIAGNOSIS — M47812 Spondylosis without myelopathy or radiculopathy, cervical region: Secondary | ICD-10-CM | POA: Diagnosis not present

## 2023-08-08 ENCOUNTER — Other Ambulatory Visit: Payer: Self-pay | Admitting: Behavioral Health

## 2023-08-08 DIAGNOSIS — F5105 Insomnia due to other mental disorder: Secondary | ICD-10-CM

## 2023-08-11 ENCOUNTER — Ambulatory Visit (HOSPITAL_BASED_OUTPATIENT_CLINIC_OR_DEPARTMENT_OTHER): Admitting: Student

## 2023-08-11 ENCOUNTER — Encounter (HOSPITAL_BASED_OUTPATIENT_CLINIC_OR_DEPARTMENT_OTHER): Payer: Self-pay | Admitting: Student

## 2023-08-11 VITALS — BP 105/69 | HR 70 | Temp 98.4°F | Resp 16 | Ht 71.0 in | Wt 201.4 lb

## 2023-08-11 DIAGNOSIS — D649 Anemia, unspecified: Secondary | ICD-10-CM

## 2023-08-11 DIAGNOSIS — L719 Rosacea, unspecified: Secondary | ICD-10-CM | POA: Diagnosis not present

## 2023-08-11 DIAGNOSIS — R252 Cramp and spasm: Secondary | ICD-10-CM

## 2023-08-11 HISTORY — DX: Rosacea, unspecified: L71.9

## 2023-08-11 HISTORY — DX: Cramp and spasm: R25.2

## 2023-08-11 HISTORY — DX: Anemia, unspecified: D64.9

## 2023-08-11 MED ORDER — DOXYCYCLINE HYCLATE 100 MG PO TABS: 100.0000 mg | ORAL_TABLET | Freq: Every day | ORAL | 0 refills | Status: DC

## 2023-08-11 NOTE — Assessment & Plan Note (Signed)
 Chronic, not at goal. Chronic muscle cramps and spasms affecting various muscle groups, exacerbated by physical activity, possibly related to electrolyte imbalance. Gatorade is insufficient for sodium needs; Element electrolyte drink is recommended. - Recommend Element electrolyte drink for sodium supplementation - Advise taking 400 mg magnesium at night - Order iron panel, vitamin B12, folate, and magnesium levels

## 2023-08-11 NOTE — Progress Notes (Signed)
 Established Patient Office Visit  Subjective   Patient ID: Aaron Fowler, male    DOB: 1970/09/11  Age: 53 y.o. MRN: [id number]  Chief Complaint  Patient presents with   Muscle cramps    Has had muscle cramps for years. Tdap due, if ok w/ PCP ok to get today.   Rosacea    Has rosacea and nose gets infected. Nose is sore.     HPI  Discussed the use of AI scribe software for clinical note transcription with the patient, who gave verbal consent to proceed.  History of Present Illness   Aaron Fowler is a 53 year old male with rosacea who presents with skin infections and muscle cramps.  He has a history of rosacea, previously managed with doxycycline, which was effective in clearing the condition. He does not currently have a dermatologist and experiences flare-ups resulting in infections on his nose. These infections have been worsening, spreading, and not resolving on his own. During flare-ups, he typically uses doxycycline for about four weeks.  He experiences muscle cramps or spasms with any muscle flexion, affecting his legs, arms, back, chest, and particularly his hands. These cramps are severe enough to disturb his sleep. He has been consuming Gatorade and liquid IV packets to address potential electrolyte imbalances but has not found relief. He has experienced episodes of kidney problems, including a past hospitalization for kidney failure.  He is currently taking Vraylar  and Seroquel  for behavioral health management, which has been effective in preventing manic episodes. He has not had a manic episode in two to three months and has been able to manage potential episodes without emergency medication. He also reports a history of anemia and has been taking potassium supplements to address his symptoms. He is not currently taking magnesium but has it available.      Patient Active Problem List   Diagnosis Date Noted   Muscle cramps 08/11/2023   Anemia 08/11/2023   Rosacea  08/11/2023   GAD (generalized anxiety disorder) 06/28/2023   Severe episode of recurrent major depressive disorder, without psychotic features (HCC) 06/28/2023   Adjustment disorder with mixed anxiety and depressed mood 07/12/2022   Other psoriasis 12/24/2019   Long term current use of systemic steroids 12/24/2019   Polyarthralgia 12/24/2019   Past Medical History:  Diagnosis Date   Anxiety    Depression    GERD (gastroesophageal reflux disease)    Insomnia    Psoriasis    per patient    Psoriatic arthritis (HCC)    Social History   Tobacco Use   Smoking status: Never    Passive exposure: Never   Smokeless tobacco: Never  Vaping Use   Vaping status: Every Day   Substances: Nicotine  Substance Use Topics   Alcohol use: Yes    Comment: light social drinker, will drink to intoxication occasionally   Drug use: Not Currently    Types: Marijuana, Other-see comments    Comment: mushrooms   Allergies  Allergen Reactions   Sulfamethoxazole Rash    Other reaction(s): Fever (intolerance)      ROS Per HPI.    Objective:     BP 105/69   Pulse 70   Temp 98.4 F (36.9 C) (Oral)   Resp 16   Ht 5\' 11"  (1.803 m)   Wt 201 lb 6.4 oz (91.4 kg)   SpO2 98%   BMI 28.09 kg/m  BP Readings from Last 3 Encounters:  08/11/23 105/69  06/28/23 116/79  07/28/22  117/83   Wt Readings from Last 3 Encounters:  08/11/23 201 lb 6.4 oz (91.4 kg)  06/28/23 200 lb 12.8 oz (91.1 kg)  07/28/22 209 lb (94.8 kg)      Physical Exam Constitutional:      General: He is not in acute distress.    Appearance: Normal appearance. He is not ill-appearing.  HENT:     Head: Normocephalic and atraumatic.     Right Ear: External ear normal.     Left Ear: External ear normal.     Nose: Nose normal.  Eyes:     Conjunctiva/sclera: Conjunctivae normal.  Cardiovascular:     Rate and Rhythm: Normal rate and regular rhythm.     Pulses: Normal pulses.     Heart sounds: Normal heart sounds. No  murmur heard.    No friction rub.  Pulmonary:     Effort: Pulmonary effort is normal. No respiratory distress.     Breath sounds: Normal breath sounds. No wheezing, rhonchi or rales.  Skin:    General: Skin is warm and dry.     Coloration: Skin is not jaundiced or pale.  Neurological:     Mental Status: He is alert.  Psychiatric:        Mood and Affect: Mood normal.        Behavior: Behavior normal.      No results found for any visits on 08/11/23.  Last CBC Lab Results  Component Value Date   WBC 5.8 06/28/2023   HGB 12.1 (L) 06/28/2023   HCT 37.0 (L) 06/28/2023   MCV 90 06/28/2023   MCH 29.5 06/28/2023   RDW 12.9 06/28/2023   PLT 208 06/28/2023   Last metabolic panel Lab Results  Component Value Date   GLUCOSE 81 07/07/2023   NA 140 07/07/2023   K 4.9 07/07/2023   CL 103 07/07/2023   CO2 21 07/07/2023   BUN 14 07/07/2023   CREATININE 1.24 07/07/2023   EGFR 70 07/07/2023   CALCIUM 9.5 07/07/2023   PROT 6.7 06/28/2023   ALBUMIN 4.3 06/28/2023   LABGLOB 2.4 06/28/2023   BILITOT 0.3 06/28/2023   ALKPHOS 70 06/28/2023   AST 32 06/28/2023   ALT 8 06/28/2023   Last lipids Lab Results  Component Value Date   CHOL 190 06/28/2023   HDL 68 06/28/2023   LDLCALC 107 (H) 06/28/2023   TRIG 83 06/28/2023   CHOLHDL 2.8 06/28/2023   Last hemoglobin A1c Lab Results  Component Value Date   HGBA1C 5.3 06/28/2023   Last vitamin B12 and Folate No results found for: "VITAMINB12", "FOLATE"    The 10-year ASCVD risk score (Arnett DK, et al., 2019) is: 2.1%    Assessment & Plan:   Rosacea Assessment & Plan: Chronic, with exacerbation. Chronic condition with severe flare-up characterized by redness, scabs, and tenderness on the nose, with signs of infection. Previous treatment with doxycycline was effective. Metronidazole cream is recommended for mild flare-ups, but due to the severity of the current infection, doxycycline is preferred initially. - Prescribe  doxycycline 100 mg daily for 30 days, use until it goes away - Refer to [facility] for further management   Orders: -     Doxycycline Hyclate; Take 1 tablet (100 mg total) by mouth daily.  Dispense: 30 tablet; Refill: 0  Anemia, unspecified type Assessment & Plan: Noted on prior labs, will assess with further labs today. Suspect that it is likley anemia of chronic disease due to rheumatologic disease presence.  Orders: -     Iron, TIBC and Ferritin Panel -     Magnesium -     B12 and Folate Panel  Muscle cramps Assessment & Plan: Chronic, not at goal. Chronic muscle cramps and spasms affecting various muscle groups, exacerbated by physical activity, possibly related to electrolyte imbalance. Gatorade is insufficient for sodium needs; Element electrolyte drink is recommended. - Recommend Element electrolyte drink for sodium supplementation - Advise taking 400 mg magnesium at night - Order iron panel, vitamin B12, folate, and magnesium levels   Return if symptoms worsen or fail to improve.    Khamiya Varin T Falicia Lizotte, PA-C

## 2023-08-11 NOTE — Assessment & Plan Note (Addendum)
 Chronic, with exacerbation. Chronic condition with severe flare-up characterized by redness, scabs, and tenderness on the nose, with signs of infection. Previous treatment with doxycycline was effective. Metronidazole cream is recommended for mild flare-ups, but due to the severity of the current infection, doxycycline is preferred initially. - Prescribe doxycycline 100 mg daily for 30 days, use until it goes away - Refer to [facility] for further management

## 2023-08-11 NOTE — Assessment & Plan Note (Signed)
 Noted on prior labs, will assess with further labs today. Suspect that it is likley anemia of chronic disease due to rheumatologic disease presence.

## 2023-08-11 NOTE — Patient Instructions (Addendum)
 It was nice to see you today!  As we discussed in clinic:  [facility] Address: [address] [place], [place], [place] [address] Phone: [phone]  If you have any problems before your next visit feel free to message me via MyChart (minor issues or questions) or call the office, otherwise you may reach out to schedule an office visit.  Thank you! Devonna Oboyle, PA-C

## 2023-08-12 LAB — IRON,TIBC AND FERRITIN PANEL
Ferritin: 187 ng/mL (ref 30–400)
Iron Saturation: 26 % (ref 15–55)
Iron: 60 ug/dL (ref 38–169)
Total Iron Binding Capacity: 233 ug/dL — ABNORMAL LOW (ref 250–450)
UIBC: 173 ug/dL (ref 111–343)

## 2023-08-12 LAB — MAGNESIUM: Magnesium: 2.1 mg/dL (ref 1.6–2.3)

## 2023-08-12 LAB — B12 AND FOLATE PANEL
Folate: >20 ng/mL (ref >3.0)
Vitamin B-12: 465 pg/mL (ref 232–1245)

## 2023-08-14 ENCOUNTER — Ambulatory Visit (HOSPITAL_BASED_OUTPATIENT_CLINIC_OR_DEPARTMENT_OTHER): Payer: Self-pay | Admitting: Student

## 2023-08-18 ENCOUNTER — Ambulatory Visit (INDEPENDENT_AMBULATORY_CARE_PROVIDER_SITE_OTHER): Payer: Self-pay | Admitting: Behavioral Health

## 2023-08-18 DIAGNOSIS — Z0389 Encounter for observation for other suspected diseases and conditions ruled out: Secondary | ICD-10-CM

## 2023-08-18 NOTE — Progress Notes (Signed)
Pt did not show for scheduled appt and did not provided 24 hour notice as required. Additional fees to be assessed.

## 2023-08-25 DIAGNOSIS — M47812 Spondylosis without myelopathy or radiculopathy, cervical region: Secondary | ICD-10-CM | POA: Diagnosis not present

## 2023-09-01 DIAGNOSIS — M47812 Spondylosis without myelopathy or radiculopathy, cervical region: Secondary | ICD-10-CM | POA: Diagnosis not present

## 2023-09-01 DIAGNOSIS — M5412 Radiculopathy, cervical region: Secondary | ICD-10-CM | POA: Diagnosis not present

## 2023-09-04 ENCOUNTER — Ambulatory Visit: Admitting: Behavioral Health

## 2023-09-04 ENCOUNTER — Encounter: Payer: Self-pay | Admitting: Behavioral Health

## 2023-09-04 DIAGNOSIS — F5105 Insomnia due to other mental disorder: Secondary | ICD-10-CM

## 2023-09-04 DIAGNOSIS — F3130 Bipolar disorder, current episode depressed, mild or moderate severity, unspecified: Secondary | ICD-10-CM

## 2023-09-04 DIAGNOSIS — R454 Irritability and anger: Secondary | ICD-10-CM

## 2023-09-04 DIAGNOSIS — F331 Major depressive disorder, recurrent, moderate: Secondary | ICD-10-CM

## 2023-09-04 DIAGNOSIS — F411 Generalized anxiety disorder: Secondary | ICD-10-CM

## 2023-09-04 DIAGNOSIS — F99 Mental disorder, not otherwise specified: Secondary | ICD-10-CM

## 2023-09-04 MED ORDER — CARIPRAZINE HCL 1.5 MG PO CAPS: 1.5000 mg | ORAL_CAPSULE | Freq: Every day | ORAL | 2 refills | Status: DC

## 2023-09-04 MED ORDER — QUETIAPINE FUMARATE 100 MG PO TABS: 100.0000 mg | ORAL_TABLET | Freq: Every day | ORAL | 2 refills | Status: DC

## 2023-09-04 NOTE — Progress Notes (Signed)
 [facility]  Patient ID: Aaron Fowler,  MRN: [id number]  PCP: Iven Lang ONEIDA DEVONNA  Date of Evaluation: 09/04/2023 Time spent:30 minutes  Chief Complaint:   HISTORY/CURRENT STATUS: HPI  HPIJames, 53 year old male presents to this office for for follow up and medication management. Smiling today, Says that he feels great except for continued sleeping problems. He did not take his medications as prescribed. So far Vraylar  has been working well for depression and his mood fluctuations. He rates his depression at 0/10 and his anxiety at 2/10.  He has been drinking wine at bedtime and understands he should cut back on his ETOh consumption. No current mania. He denies any psychosis or auditory or visual hallucinations.  He verbally contracted for safety with this Clinical research associate.     Past psychiatric medication trials:   Trintellix  Ambien     Individual Medical History/ Review of Systems: Changes? :No   Allergies: Sulfamethoxazole  Current Medications:  Current Outpatient Medications:    QUEtiapine  (SEROQUEL ) 100 MG tablet, Take 1 tablet (100 mg total) by mouth at bedtime., Disp: 30 tablet, Rfl: 2   Calcium Carbonate (CALCIUM 500 PO), Take 500 mg by mouth daily., Disp: , Rfl:    cariprazine  (VRAYLAR ) 1.5 MG capsule, Take 1 capsule (1.5 mg total) by mouth daily., Disp: 30 capsule, Rfl: 2   clonazePAM  (KLONOPIN ) 0.5 MG tablet, Take 1 tablet (0.5 mg total) by mouth daily. (Patient taking differently: Take 0.5 mg by mouth as needed.), Disp: 30 tablet, Rfl: 0   doxycycline  (VIBRA -TABS) 100 MG tablet, Take 1 tablet (100 mg total) by mouth daily., Disp: 30 tablet, Rfl: 0   Multiple Vitamins-Minerals (MULTIVITAMIN ADULTS PO), Take by mouth daily., Disp: , Rfl:    omeprazole (PRILOSEC) 20 MG capsule, Take 20 mg by mouth daily., Disp: , Rfl:    Potassium 99 MG TABS, Take 99 mg by mouth daily., Disp: , Rfl:    predniSONE  (DELTASONE ) 5 MG tablet, Take 5 mg by mouth daily., Disp: , Rfl:     QUEtiapine  (SEROQUEL ) 25 MG tablet, Take 1-2 tablet by mouth daily at bedtime for sleep and anxiety, Disp: 60 tablet, Rfl: 1   tadalafil (CIALIS) 5 MG tablet, Take 5 mg by mouth daily., Disp: , Rfl:    tiZANidine (ZANAFLEX) 4 MG tablet, Take 4 mg by mouth 3 (three) times daily., Disp: , Rfl:  Medication Side Effects: none  Family Medical/ Social History: Changes? No  MENTAL HEALTH EXAM:  There were no vitals taken for this visit.There is no height or weight on file to calculate BMI.  General Appearance: Casual, Neat, and Well Groomed  Eye Contact:  Good  Speech:  Clear and Coherent  Volume:  Normal  Mood:  NA  Affect:  Appropriate  Thought Process:  Coherent  Orientation:  Full (Time, Place, and Person)  Thought Content: Logical   Suicidal Thoughts:  No  Homicidal Thoughts:  No  Memory:  WNL  Judgement:  Fair  Insight:  Fair  Psychomotor Activity:  Normal  Concentration:  Concentration: Good  Recall:  Good  Fund of Knowledge: Good  Language: Good  Assets:  Desire for Improvement  ADL's:  Intact  Cognition: WNL  Prognosis:  Good    DIAGNOSES:    ICD-10-CM   1. Bipolar I disorder, most recent episode depressed (HCC)  F31.30 cariprazine  (VRAYLAR ) 1.5 MG capsule    2. Generalized anxiety disorder  F41.1 QUEtiapine  (SEROQUEL ) 100 MG tablet    3. Anger  R45.4  4. Insomnia due to other mental disorder  F51.05 QUEtiapine  (SEROQUEL ) 100 MG tablet   F99     5. Major depressive disorder, recurrent episode, moderate (HCC)  F33.1 cariprazine  (VRAYLAR ) 1.5 MG capsule      Receiving Psychotherapy: No    RECOMMENDATIONS:   Greater than 50% of 30  min face to face time with patient was spent on counseling and coordination of care.  We discussed his recent significant improvement with rapid mood fluctuations and severe depression. He is very happy with how Vraylar  has been working.  I counseled him on using caution about ETOH use and how it could interfere or interact with  his psychiatric medications.  Reinforced good sleep hygiene. Take Seroquel  30 min before bed, no blue light contamination, tv off and dark room. Explained that medications typically will not work unless you are practicing good sleep hygiene as well.    We agreed today to:   To continue Vraylar  1.5 mg daily after breakfast. Two weeks samples provided along with discount drug card.  To increase Seroquel  to 100 mg at bedtime for sleep and anxiety.  To follow up in 8 weeks to reassess Will report worsening symptoms or side effects immediately Provided emergency contact information to include 911, nearest ER, [facility], or after-hours emergency line. Monitor for any sign of rash. Please taking Lamictal  and contact office immediately rash develops. Recommend seeking urgent medical attention if rash is severe and/or spreading quickly.  Discussed potential metabolic side effects associated with atypical antipsychotics, as well as potential risk for movement side effects. Advised pt to contact office if movement side effects occur.   Reviewed PDMP   [name], NP

## 2023-09-20 DIAGNOSIS — M5412 Radiculopathy, cervical region: Secondary | ICD-10-CM | POA: Diagnosis not present

## 2023-09-23 ENCOUNTER — Encounter (HOSPITAL_BASED_OUTPATIENT_CLINIC_OR_DEPARTMENT_OTHER): Payer: Self-pay

## 2023-09-23 ENCOUNTER — Ambulatory Visit (HOSPITAL_BASED_OUTPATIENT_CLINIC_OR_DEPARTMENT_OTHER)
Admission: EM | Admit: 2023-09-23 | Discharge: 2023-09-23 | Disposition: A | Discharge status: Home / Self Care | Attending: Family Medicine | Admitting: Family Medicine

## 2023-09-23 DIAGNOSIS — R110 Nausea: Secondary | ICD-10-CM

## 2023-09-23 DIAGNOSIS — R519 Headache, unspecified: Secondary | ICD-10-CM

## 2023-09-23 MED ORDER — KETOROLAC TROMETHAMINE 30 MG/ML IJ SOLN
30.0000 mg | Freq: Once | INTRAMUSCULAR | Status: AC
  Administered 2023-09-23: 30 mg via INTRAMUSCULAR

## 2023-09-23 MED ORDER — ONDANSETRON HCL 4 MG/2ML IJ SOLN
4.0000 mg | Freq: Once | INTRAMUSCULAR | Status: AC
  Administered 2023-09-23: 4 mg via INTRAMUSCULAR

## 2023-09-23 NOTE — Discharge Instructions (Signed)
 We have treated you today for migraine.  I am glad that you are feeling better.  Go home and rest and get some sleep.  You can take your Nurtec at home if needed.  Follow-up as needed

## 2023-09-23 NOTE — ED Triage Notes (Signed)
 Patient woke up with a severe headache and chills. States hx of migraine headaches and unsure if this is a migraine or other illness. States headache is in the back of his head. + vomiting.

## 2023-09-23 NOTE — ED Provider Notes (Signed)
 [facility]    CSN: [id number] Arrival date & time: 09/23/23  0837      History   Chief Complaint Chief Complaint  Patient presents with   Headache    HPI Aaron Fowler is a 53 y.o. male.   Patient is a 53 year old male who presents today with headache.  History of migraines.  Headache is located to the occipital area which is typical for his migraines.  Patient woke up around 4 this morning with a severe headache and chills.  He is positive for nausea and vomiting.  He has had no changes in vision, blurry vision, dizziness, weakness or slurred speech.  No fever. Took tylenol and Zofran  but vomited up.    Headache   Past Medical History:  Diagnosis Date   Anxiety    Depression    GERD (gastroesophageal reflux disease)    Insomnia    Psoriasis    per patient    Psoriatic arthritis [facility])     Patient Active Problem List   Diagnosis Date Noted   Muscle cramps 08/11/2023   Anemia 08/11/2023   Rosacea 08/11/2023   GAD (generalized anxiety disorder) 06/28/2023   Severe episode of recurrent major depressive disorder, without psychotic features (HCC) 06/28/2023   Adjustment disorder with mixed anxiety and depressed mood 07/12/2022   Other psoriasis 12/24/2019   Long term current use of systemic steroids 12/24/2019   Polyarthralgia 12/24/2019    Past Surgical History:  Procedure Laterality Date   ANAL FISSURE REPAIR     BONE MARROW BIOPSY     CATARACT EXTRACTION, BILATERAL     10/28/2019, 12/16/2019    CYST EXCISION     on buttock    LYMPH NODE BIOPSY         Home Medications    Prior to Admission medications   Medication Sig Start Date End Date Taking? Authorizing Provider  Calcium Carbonate (CALCIUM 500 PO) Take 500 mg by mouth daily.    [provider]  cariprazine  (VRAYLAR ) 1.5 MG capsule Take 1 capsule (1.5 mg total) by mouth daily. 09/04/23   Teresa Redell LABOR, NP  clonazePAM  (KLONOPIN ) 0.5 MG tablet Take 1 tablet (0.5 mg total) by  mouth daily. Patient taking differently: Take 0.5 mg by mouth as needed. 06/28/23   Rothfuss, Jacob T, PA-C  doxycycline  (VIBRA -TABS) 100 MG tablet Take 1 tablet (100 mg total) by mouth daily. 08/11/23   Rothfuss, Jacob T, PA-C  Multiple Vitamins-Minerals (MULTIVITAMIN ADULTS PO) Take by mouth daily.    [provider]  omeprazole (PRILOSEC) 20 MG capsule Take 20 mg by mouth daily.    [provider]  Potassium 99 MG TABS Take 99 mg by mouth daily.    [provider]  predniSONE  (DELTASONE ) 5 MG tablet Take 5 mg by mouth daily.    [provider]  QUEtiapine  (SEROQUEL ) 100 MG tablet Take 1 tablet (100 mg total) by mouth at bedtime. 09/04/23   Teresa Redell LABOR, NP  QUEtiapine  (SEROQUEL ) 25 MG tablet Take 1-2 tablet by mouth daily at bedtime for sleep and anxiety 07/14/23   Teresa Redell A, NP  tadalafil (CIALIS) 5 MG tablet Take 5 mg by mouth daily. 06/29/22   [provider]  tiZANidine (ZANAFLEX) 4 MG tablet Take 4 mg by mouth 3 (three) times daily. 12/13/19   [provider]    Family History Family History  Problem Relation Age of Onset   Heart Problems Mother    Alcohol abuse Father  Prostate cancer Father        anget orange   Autoimmune disease Sister    Dementia Maternal Grandmother     Social History Social History   Tobacco Use   Smoking status: Never    Passive exposure: Never   Smokeless tobacco: Never  Vaping Use   Vaping status: Every Day   Substances: Nicotine  Substance Use Topics   Alcohol use: Yes    Comment: light social drinker, will drink to intoxication occasionally   Drug use: Not Currently    Types: Marijuana, Other-see comments    Comment: mushrooms     Allergies   Sulfamethoxazole   Review of Systems Review of Systems  Neurological:  Positive for headaches.   See HPI  Physical Exam Triage Vital Signs ED Triage Vitals  Encounter Vitals Group     BP 09/23/23 0852 115/79     Girls Systolic BP  Percentile --      Girls Diastolic BP Percentile --      Boys Systolic BP Percentile --      Boys Diastolic BP Percentile --      Pulse Rate 09/23/23 0852 64     Resp 09/23/23 0852 20     Temp 09/23/23 0852 98.4 F (36.9 C)     Temp Source 09/23/23 0852 Oral     SpO2 09/23/23 0852 98 %     Weight --      Height --      Head Circumference --      Peak Flow --      Pain Score 09/23/23 0854 8     Pain Loc --      Pain Education --      Exclude from Growth Chart --    No data found.  Updated Vital Signs BP 113/76 (BP Location: Right Arm)   Pulse (!) 57   Temp 98.4 F (36.9 C) (Oral)   Resp 20   SpO2 98%   Visual Acuity Right Eye Distance:   Left Eye Distance:   Bilateral Distance:    Right Eye Near:   Left Eye Near:    Bilateral Near:     Physical Exam Constitutional:      Appearance: Normal appearance. He is ill-appearing.  HENT:     Nose: Nose normal.  Eyes:     Comments: Bilateral scleral injection  Pulmonary:     Effort: Pulmonary effort is normal.  Musculoskeletal:        General: Normal range of motion.  Skin:    General: Skin is warm and dry.  Neurological:     General: No focal deficit present.     Mental Status: He is alert.  Psychiatric:        Mood and Affect: Mood normal.      UC Treatments / Results  Labs (all labs ordered are listed, but only abnormal results are displayed) Labs Reviewed - No data to display  EKG   Radiology No results found.  Procedures Procedures (including critical care time)  Medications Ordered in UC Medications  ketorolac  (TORADOL ) 30 MG/ML injection 30 mg (30 mg Intramuscular Given 09/23/23 0922)  ondansetron  (ZOFRAN ) injection 4 mg (4 mg Intramuscular Given 09/23/23 9077)    Initial Impression / Assessment and Plan / UC Course  I have reviewed the triage vital signs and the nursing notes.  Pertinent labs & imaging results that were available during my care of the patient were reviewed by me and  considered in  my medical decision making (see chart for details).     Headache with nausea and vomiting-not sure if this is typical migraine for him  Or if he has some sort of virus.   No red flags  Treatment here today included Zofran  for nausea and Toradol  for pain.  Symptoms did improve.  Recommend go home, rest and he can take his Nurtec at home if needed.  Otherwise Tylenol, Zofran  as needed If symptoms worsen he will need to go to the ER. Final Clinical Impressions(s) / UC Diagnoses   Final diagnoses:  Nonintractable headache, unspecified chronicity pattern, unspecified headache type  Nausea without vomiting     Discharge Instructions      We have treated you today for migraine.  I am glad that you are feeling better.  Go home and rest and get some sleep.  You can take your Nurtec at home if needed.  Follow-up as needed    ED Prescriptions   None    PDMP not reviewed this encounter.   Adah Wilbert LABOR, FNP 09/23/23 [phone]

## 2023-09-23 NOTE — ED Notes (Signed)
 Patient reports pain improved and ready to go home.

## 2023-10-06 ENCOUNTER — Encounter (HOSPITAL_BASED_OUTPATIENT_CLINIC_OR_DEPARTMENT_OTHER): Admitting: Student

## 2023-10-13 DIAGNOSIS — M47812 Spondylosis without myelopathy or radiculopathy, cervical region: Secondary | ICD-10-CM | POA: Diagnosis not present

## 2023-10-13 DIAGNOSIS — M5459 Other low back pain: Secondary | ICD-10-CM | POA: Diagnosis not present

## 2023-10-13 DIAGNOSIS — M5412 Radiculopathy, cervical region: Secondary | ICD-10-CM | POA: Diagnosis not present

## 2023-10-13 DIAGNOSIS — M47816 Spondylosis without myelopathy or radiculopathy, lumbar region: Secondary | ICD-10-CM | POA: Diagnosis not present

## 2023-10-17 ENCOUNTER — Ambulatory Visit (HOSPITAL_BASED_OUTPATIENT_CLINIC_OR_DEPARTMENT_OTHER): Admitting: Student

## 2023-10-17 ENCOUNTER — Encounter (HOSPITAL_BASED_OUTPATIENT_CLINIC_OR_DEPARTMENT_OTHER): Payer: Self-pay | Admitting: Student

## 2023-10-17 VITALS — BP 116/79 | HR 67 | Temp 98.2°F | Resp 16 | Ht 71.0 in | Wt 201.0 lb

## 2023-10-17 DIAGNOSIS — R3915 Urgency of urination: Secondary | ICD-10-CM

## 2023-10-17 DIAGNOSIS — Z23 Encounter for immunization: Secondary | ICD-10-CM | POA: Diagnosis not present

## 2023-10-17 DIAGNOSIS — D739 Disease of spleen, unspecified: Secondary | ICD-10-CM | POA: Diagnosis not present

## 2023-10-17 DIAGNOSIS — N528 Other male erectile dysfunction: Secondary | ICD-10-CM | POA: Diagnosis not present

## 2023-10-17 MED ORDER — TADALAFIL 10 MG PO TABS: 10.0000 mg | ORAL_TABLET | ORAL | 2 refills | Status: DC | PRN

## 2023-10-17 NOTE — Patient Instructions (Signed)
 It was nice to see you today!  If you have any problems before your next visit feel free to message me via MyChart (minor issues or questions) or call the office, otherwise you may reach out to schedule an office visit.  Thank you! Pau Banh, PA-C

## 2023-10-17 NOTE — Progress Notes (Addendum)
 Established Patient Office Visit  Subjective   Patient ID: Aaron Fowler, male    DOB: January 04, 1971  Age: 53 y.o. MRN: [id number]  Chief Complaint  Patient presents with   Imaging results    On 10/13/2023, had x-ray done and is showing a calcified spleen.     HPI  Discussed the use of AI scribe software for clinical note transcription with the patient, who gave verbal consent to proceed.  History of Present Illness   Aaron Fowler is a 53 year old male who presents with findings of calcified granulomas in the spleen discovered on a lower back x-ray.  He underwent a lower back x-ray which incidentally revealed calcified granulomas in the spleen. He was initially alarmed by the findings, as there was uncertainty about the location and nature of the calcifications, whether they were in the lung, kidney, or spleen.  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 Otherwise he has been in [place] for all of his life. He has been on prednisone  since around 2005 and has also used Humira and Enbrel for his conditions. He has been told in the past that he had an enlarged spleen but the granuloma was never commented on per patient. See imaging history below- apparently this has been commented on in imaging numerous times.  No symptoms such as night sweats, chronic cough, or rashes. His medical history includes joint issues, rosacea, psoriasis, and arthritis, with inflammation being a significant problem. He has been tested for hepatitis and HIV in the past, with negative results, and has received hepatitis vaccinations due to his work and foster care requirements.  He experiences overactive bladder symptoms, characterized by frequent urination, especially at night and during road trips. He has not consulted a urologist for this issue. He has tried Viagra in the past, which caused headaches, and is  currently using Cialis  for erectile dysfunction, preferring spot treatment over daily use.         Pertinent imaging and notes upon chart review:  2004 History:  Febrile illness with sweats and lymphadenopathy bmbx no malignancy, resolved  CT Pelvis with Contrast from 02/07/2003:  IMPRESSION: 1. Mild splenic enlargement with diffusely abnormal splenic enhancement.  This is not felt to represent delayed splenic enhancement, and infiltrative processes such as lymphoma and infectious etiologies must be considered.   2.  Adenopathy at splenic hilum and left periaortic.   3.  Tiny right renal calculi without hydronephrosis.   Bone Marrow Biopsy 01/2003: Normocellular marrow, increased iron stores, no granulomata or lymphoproliferative process.  Lymph node biopsy 01/2004: Benign lymph node  CT pelvis with contrast from 07/27/2004 IMPRESSION: 1.   Adenopathy in the splenic hilum has markedly improved.  Mesenteric adenopathy is better.  Retroperitoneal adenopathy is not significantly changed.  This supports a benign etiology such as an inflammatory process.  2.   Splenic lesions with calcifications would support a granulomatous etiology such as fungal infection.   Around 2010: he had a febrile illness with about 60 pounds weight loss, fatigue and night sweats. He had extensive evaluation by several specialties evaluation this included lymph node and bone marrow biopsies. These were non revealing. His symptoms improved overtime, though they did not resolve completely.   LABS 2011:  Nml CBC, CMP, CPK, ESR and aldolase. Negative ANCA and ANA.  CT abdomen and pelvis without contrast 10/04/2016:  IMPRESSION:  1.  No acute intra-abdominal or pelvic pathology.  No bowel obstruction or active inflammation. 2.  Diffuse small splenic calcification may represent calcified granuloma or related to underlying systemic inflammatory process. 3.  Small nonobstructing right renal calculi.  No  hydronephrosis.  MRI abdomen with and without contrast 10/05/2016:  IMPRESSION:1.  No acute findings in the abdomen to account for the patient's symptoms. 2. innumerable calcifications again noted throughout the spleen, presumably sequela of prior infection. Findings-spleen: Multiple areas of susceptibility artifact in the periphery of the spleen correspond to the calcified granulomas noted on the recent CT examination.  Most recent imaging report pending record request.  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   Urinary urgency 10/20/2023   Muscle cramps 08/11/2023   Anemia 08/11/2023   Rosacea 08/11/2023   GAD (generalized anxiety disorder) 06/28/2023   Severe episode of recurrent major depressive disorder, without psychotic features (HCC) 06/28/2023   Adjustment disorder with mixed anxiety and depressed mood 07/12/2022   Other psoriasis 12/24/2019   Long term current use of systemic steroids 12/24/2019   Polyarthralgia 12/24/2019   Past Medical History:  Diagnosis Date   Anxiety    Depression    GERD (gastroesophageal reflux disease)    Insomnia    Psoriasis    per patient    Psoriatic arthritis (HCC)    Social History   Tobacco Use   Smoking status: Never    Passive exposure: Never   Smokeless tobacco: Never  Vaping Use   Vaping status: Every Day   Substances: Nicotine  Substance Use Topics   Alcohol use: Yes    Comment: light social drinker, will drink to intoxication occasionally   Drug use: Not Currently    Types: Marijuana, Other-see comments    Comment: mushrooms   Allergies  Allergen Reactions   Sulfamethoxazole Rash    Other reaction(s): Fever (intolerance)      ROS Per HPI.    Objective:     BP 116/79   Pulse 67   Temp 98.2 F (36.8 C) (Oral)   Resp 16   Ht 5' 11 (1.803 m)   Wt 201 lb (91.2 kg)   SpO2 98%   BMI  28.03 kg/m  BP Readings from Last 3 Encounters:  10/17/23 116/79  09/23/23 113/76  08/11/23 105/69   Wt Readings from Last 3 Encounters:  10/17/23 201 lb (91.2 kg)  08/11/23 201 lb 6.4 oz (91.4 kg)  06/28/23 200 lb 12.8 oz (91.1 kg)      Physical Exam Constitutional:      General: He is not in acute distress.    Appearance: Normal appearance. He is not ill-appearing.  HENT:     Head: Normocephalic and atraumatic.     Right Ear: External ear normal.     Left Ear: External ear normal.     Nose: Nose normal.  Eyes:     Conjunctiva/sclera: Conjunctivae normal.  Cardiovascular:     Rate and Rhythm: Normal rate and regular rhythm.     Pulses: Normal pulses.     Heart sounds: Normal heart sounds. No murmur heard.    No friction rub.  Pulmonary:     Effort: Pulmonary effort is normal. No respiratory distress.     Breath sounds: Normal breath sounds. No wheezing, rhonchi or rales.  Abdominal:     General: Abdomen is flat. Bowel sounds are  normal. There is no distension.     Tenderness: There is no abdominal tenderness. There is no guarding or rebound.     Hernia: No hernia is present.     Comments: Could not easily palpate spleen on exam  Skin:    General: Skin is warm and dry.     Coloration: Skin is not jaundiced or pale.  Neurological:     Mental Status: He is alert.  Psychiatric:        Mood and Affect: Mood normal.        Behavior: Behavior normal.      No results found for any visits on 10/17/23.  Last CBC Lab Results  Component Value Date   WBC 5.8 06/28/2023   HGB 12.1 (L) 06/28/2023   HCT 37.0 (L) 06/28/2023   MCV 90 06/28/2023   MCH 29.5 06/28/2023   RDW 12.9 06/28/2023   PLT 208 06/28/2023   Last metabolic panel Lab Results  Component Value Date   GLUCOSE 81 07/07/2023   NA 140 07/07/2023   K 4.9 07/07/2023   CL 103 07/07/2023   CO2 21 07/07/2023   BUN 14 07/07/2023   CREATININE 1.24 07/07/2023   EGFR 70 07/07/2023   CALCIUM 9.5 07/07/2023    PROT 6.7 06/28/2023   ALBUMIN 4.3 06/28/2023   LABGLOB 2.4 06/28/2023   BILITOT 0.3 06/28/2023   ALKPHOS 70 06/28/2023   AST 32 06/28/2023   ALT 8 06/28/2023   Last lipids Lab Results  Component Value Date   CHOL 190 06/28/2023   HDL 68 06/28/2023   LDLCALC 107 (H) 06/28/2023   TRIG 83 06/28/2023   CHOLHDL 2.8 06/28/2023   Last hemoglobin A1c Lab Results  Component Value Date   HGBA1C 5.3 06/28/2023      The 10-year ASCVD risk score (Arnett DK, et al., 2019) is: 2.5%    Assessment & Plan:   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 Calcified granulomas identified in the spleen on a lower back x-ray, suggesting prior granulomatous disease. Differential diagnosis includes history of histoplasmosis, inflammatory disease, or other granulomatous diseases. He has lived in [place] , where histoplasmosis is prevalent. No current symptoms such as night sweats, cough, or chronic cough reported but he did have several instances in his history base don chart review. Cannot find prior TB test or histoplasmosis testing completed. - Order CT of the abdomen and pelvis without contrast - [facility]  UPDATE: Based on most recent CT- likely from a prior granulomatous disease. Due to chronic prednisone  use, I will still want him checked out by infectious disease but I do not believe that any further workup will be necessary.  Orders: -     CT ABDOMEN PELVIS WO CONTRAST; Future  Other male erectile dysfunction Assessment & Plan: Chronic condition, not at goal. He has previously tried Viagra but experienced headaches. Currently using Cialis  daily but requests to switch to as-needed use with a higher dose for more effectiveness. - Prescribe Cialis  10 mg for as-needed use, with a prescription for 15 tablets and refills - Advise taking Cialis  every 48 hours as needed  Orders: -     Tadalafil ; Take 1 tablet (10 mg total) by mouth every other day as needed  for erectile dysfunction.  Dispense: 15 tablet; Refill: 2  Encounter for immunization -     Tdap vaccine greater than or equal to 7yo IM  Urinary urgency Assessment & Plan: Chronic, not at goal. Long-standing issue with frequent  urination and urgency, especially at night and during road trips. No weak urinary stream reported. He has not seen a urologist for this condition. - Consider referral to urologist for further evaluation and bladder training - Discuss dietary modifications to reduce bladder irritants such as caffeine, sparkling drinks, and spicy foods    Return in about 6 weeks (around 11/28/2023) for imaging followup.    Para Cossey T Tonee Silverstein, PA-C

## 2023-10-20 ENCOUNTER — Encounter (HOSPITAL_BASED_OUTPATIENT_CLINIC_OR_DEPARTMENT_OTHER): Payer: Self-pay

## 2023-10-20 DIAGNOSIS — R3915 Urgency of urination: Secondary | ICD-10-CM | POA: Insufficient documentation

## 2023-10-20 DIAGNOSIS — N528 Other male erectile dysfunction: Secondary | ICD-10-CM | POA: Insufficient documentation

## 2023-10-20 DIAGNOSIS — Z23 Encounter for immunization: Secondary | ICD-10-CM

## 2023-10-20 DIAGNOSIS — D739 Disease of spleen, unspecified: Secondary | ICD-10-CM | POA: Insufficient documentation

## 2023-10-20 HISTORY — DX: Encounter for immunization: Z23

## 2023-10-20 HISTORY — DX: Urgency of urination: R39.15

## 2023-10-20 HISTORY — DX: Other male erectile dysfunction: N52.8

## 2023-10-20 HISTORY — DX: Disease of spleen, unspecified: D73.9

## 2023-10-20 NOTE — Assessment & Plan Note (Signed)
 Chronic condition, not at goal. He has previously tried Viagra but experienced headaches. Currently using Cialis  daily but requests to switch to as-needed use with a higher dose for more effectiveness. - Prescribe Cialis  10 mg for as-needed use, with a prescription for 15 tablets and refills - Advise taking Cialis  every 48 hours as needed

## 2023-10-20 NOTE — Assessment & Plan Note (Signed)
 Chronic, not at goal. Long-standing issue with frequent urination and urgency, especially at night and during road trips. No weak urinary stream reported. He has not seen a urologist for this condition. - Consider referral to urologist for further evaluation and bladder training - Discuss dietary modifications to reduce bladder irritants such as caffeine, sparkling drinks, and spicy foods

## 2023-10-20 NOTE — Assessment & Plan Note (Signed)
 Uncertain prognosis. Calcified granulomas identified in the spleen on a lower back x-ray, suggesting prior granulomatous disease. Differential diagnosis includes histoplasmosis, sarcoidosis, TB, inflammatory disease, or other granulomatous diseases. He has lived in [place] , where histoplasmosis is prevalent. No current symptoms such as night sweats, cough, or chronic cough reported. Cannot find prior TB test or histoplasmosis testing completed- several instances  - Order CT of the abdomen and pelvis without contrast - Schedule imaging at the [facility]

## 2023-10-24 DIAGNOSIS — L719 Rosacea, unspecified: Secondary | ICD-10-CM | POA: Diagnosis not present

## 2023-10-24 DIAGNOSIS — L409 Psoriasis, unspecified: Secondary | ICD-10-CM | POA: Diagnosis not present

## 2023-10-27 ENCOUNTER — Ambulatory Visit (INDEPENDENT_AMBULATORY_CARE_PROVIDER_SITE_OTHER)
Admission: RE | Admit: 2023-10-27 | Discharge: 2023-10-27 | Disposition: A | Discharge status: Home / Self Care | Source: Ambulatory Visit | Attending: Student | Admitting: Student

## 2023-10-27 DIAGNOSIS — D739 Disease of spleen, unspecified: Secondary | ICD-10-CM | POA: Diagnosis not present

## 2023-10-27 DIAGNOSIS — K5730 Diverticulosis of large intestine without perforation or abscess without bleeding: Secondary | ICD-10-CM | POA: Diagnosis not present

## 2023-10-27 DIAGNOSIS — R935 Abnormal findings on diagnostic imaging of other abdominal regions, including retroperitoneum: Secondary | ICD-10-CM | POA: Diagnosis not present

## 2023-11-03 ENCOUNTER — Ambulatory Visit (INDEPENDENT_AMBULATORY_CARE_PROVIDER_SITE_OTHER): Admitting: Behavioral Health

## 2023-11-03 ENCOUNTER — Encounter: Payer: Self-pay | Admitting: Behavioral Health

## 2023-11-03 DIAGNOSIS — F3130 Bipolar disorder, current episode depressed, mild or moderate severity, unspecified: Secondary | ICD-10-CM | POA: Diagnosis not present

## 2023-11-03 DIAGNOSIS — F332 Major depressive disorder, recurrent severe without psychotic features: Secondary | ICD-10-CM

## 2023-11-03 DIAGNOSIS — F411 Generalized anxiety disorder: Secondary | ICD-10-CM | POA: Diagnosis not present

## 2023-11-03 DIAGNOSIS — F331 Major depressive disorder, recurrent, moderate: Secondary | ICD-10-CM

## 2023-11-03 MED ORDER — CARIPRAZINE HCL 1.5 MG PO CAPS: 1.5000 mg | ORAL_CAPSULE | Freq: Every day | ORAL | 3 refills | Status: DC

## 2023-11-03 MED ORDER — CLONAZEPAM 0.5 MG PO TABS: 0.5000 mg | ORAL_TABLET | Freq: Every day | ORAL | 3 refills | Status: DC

## 2023-11-03 NOTE — Progress Notes (Signed)
 [facility]  Patient ID: Aaron Fowler,  MRN: [id number]  PCP: Iven Lang ONEIDA DEVONNA  Date of Evaluation: 11/03/2023 Time spent:0 minutes  Chief Complaint:  Chief Complaint   Anxiety; Depression; Follow-up; Patient Education; Medication Refill     HISTORY/CURRENT STATUS: HPI HPIJames, 53 year old male presents to this office for for follow up and medication management. Reports continued great stability.  Smiling today, Says that he feels great except for continued sleeping problems. He did not take his medications as prescribed. So far Vraylar  has been working well for depression and his mood fluctuations. He rates his depression at 0/10 and his anxiety at 2/10.  He has been drinking wine at bedtime and understands he should cut back on his ETOh consumption. No current mania. He denies any psychosis or auditory or visual hallucinations.  He verbally contracted for safety with this Clinical research associate.     Past psychiatric medication trials:   Trintellix  Ambien      Individual Medical History/ Review of Systems: Changes? :No   Allergies: Sulfamethoxazole  Current Medications:  Current Outpatient Medications:    Calcium Carbonate (CALCIUM 500 PO), Take 500 mg by mouth daily., Disp: , Rfl:    cariprazine  (VRAYLAR ) 1.5 MG capsule, Take 1 capsule (1.5 mg total) by mouth daily., Disp: 30 capsule, Rfl: 3   clonazePAM  (KLONOPIN ) 0.5 MG tablet, Take 1 tablet (0.5 mg total) by mouth daily., Disp: 30 tablet, Rfl: 3   doxycycline  (VIBRA -TABS) 100 MG tablet, Take 1 tablet (100 mg total) by mouth daily. (Patient not taking: Reported on 10/17/2023), Disp: 30 tablet, Rfl: 0   Multiple Vitamins-Minerals (MULTIVITAMIN ADULTS PO), Take by mouth daily., Disp: , Rfl:    omeprazole (PRILOSEC) 20 MG capsule, Take 20 mg by mouth daily., Disp: , Rfl:    Potassium 99 MG TABS, Take 99 mg by mouth daily. (Patient not taking: Reported on 10/17/2023), Disp: , Rfl:    predniSONE  (DELTASONE ) 5 MG tablet,  Take 5 mg by mouth daily., Disp: , Rfl:    QUEtiapine  (SEROQUEL ) 100 MG tablet, Take 1 tablet (100 mg total) by mouth at bedtime. (Patient taking differently: Take 50 mg by mouth at bedtime.), Disp: 30 tablet, Rfl: 2   QUEtiapine  (SEROQUEL ) 25 MG tablet, Take 1-2 tablet by mouth daily at bedtime for sleep and anxiety (Patient not taking: Reported on 10/17/2023), Disp: 60 tablet, Rfl: 1   tadalafil  (CIALIS ) 10 MG tablet, Take 1 tablet (10 mg total) by mouth every other day as needed for erectile dysfunction., Disp: 15 tablet, Rfl: 2   tadalafil  (CIALIS ) 5 MG tablet, Take 5 mg by mouth daily., Disp: , Rfl:    tiZANidine (ZANAFLEX) 4 MG tablet, Take 4 mg by mouth 3 (three) times daily., Disp: , Rfl:  Medication Side Effects: none  Family Medical/ Social History: Changes? No  MENTAL HEALTH EXAM:  There were no vitals taken for this visit.There is no height or weight on file to calculate BMI.  General Appearance: Casual, Neat, and Well Groomed  Eye Contact:  Good  Speech:  Clear and Coherent  Volume:  Normal  Mood:  NA  Affect:  Appropriate  Thought Process:  Coherent  Orientation:  Full (Time, Place, and Person)  Thought Content: Logical   Suicidal Thoughts:  No  Homicidal Thoughts:  No  Memory:  WNL  Judgement:  Good  Insight:  Good  Psychomotor Activity:  Normal  Concentration:  Concentration: Good  Recall:  Good  Fund of Knowledge: Good  Language: Good  Assets:  Desire for Improvement  ADL's:  Intact  Cognition: WNL  Prognosis:  Good    DIAGNOSES:    ICD-10-CM   1. Severe episode of recurrent major depressive disorder, without psychotic features (HCC)  F33.2 clonazePAM  (KLONOPIN ) 0.5 MG tablet    2. GAD (generalized anxiety disorder)  F41.1 clonazePAM  (KLONOPIN ) 0.5 MG tablet    3. Bipolar I disorder, most recent episode depressed (HCC)  F31.30 cariprazine  (VRAYLAR ) 1.5 MG capsule    4. Major depressive disorder, recurrent episode, moderate (HCC)  F33.1 cariprazine   (VRAYLAR ) 1.5 MG capsule      Receiving Psychotherapy: No    RECOMMENDATIONS:  Greater than 50% of 30  min face to face time with patient was spent on counseling and coordination of care.  We discussed his continued significant improvement with rapid mood fluctuations and severe depression. He is very happy with how Vraylar  has been working.  I counseled him on using caution about ETOH use and how it could interfere or interact with his psychiatric medications.  Reinforced good sleep hygiene. Currently not taking Seroquel  for sleep. Explained that medications typically will not work unless you are practicing good sleep hygiene as well.    We agreed today to:   To continue Vraylar  1.5 mg daily after breakfast. Two weeks samples provided along with discount drug card.  To hold Seroquel  to 100 mg at bedtime for sleep and anxiety.  To follow up in 3 months to reassess Will report worsening symptoms or side effects immediately Provided emergency contact information to include 911, nearest ER, [facility], or after-hours emergency line. Monitor for any sign of rash. Please taking Lamictal  and contact office immediately rash develops. Recommend seeking urgent medical attention if rash is severe and/or spreading quickly.  Discussed potential metabolic side effects associated with atypical antipsychotics, as well as potential risk for movement side effects. Advised pt to contact office if movement side effects occur.   Reviewed PDMP        [name], NP

## 2023-11-08 ENCOUNTER — Ambulatory Visit: Admitting: Infectious Diseases

## 2023-11-08 ENCOUNTER — Other Ambulatory Visit: Payer: Self-pay

## 2023-11-08 ENCOUNTER — Encounter: Payer: Self-pay | Admitting: Infectious Diseases

## 2023-11-08 VITALS — BP 109/79 | HR 93 | Temp 98.2°F | Ht 71.0 in | Wt 200.0 lb

## 2023-11-08 DIAGNOSIS — R911 Solitary pulmonary nodule: Secondary | ICD-10-CM

## 2023-11-08 DIAGNOSIS — D7389 Other diseases of spleen: Secondary | ICD-10-CM

## 2023-11-08 HISTORY — DX: Solitary pulmonary nodule: R91.1

## 2023-11-08 HISTORY — DX: Other diseases of spleen: D73.89

## 2023-11-08 NOTE — Progress Notes (Addendum)
 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   CARIPRAZINE  (VRAYLAR ) 1.5 MG CAPSULE    Take 1 capsule (1.5 mg total) by mouth daily.   CLONAZEPAM  (KLONOPIN ) 0.5 MG TABLET    Take 1 tablet (0.5 mg total) by mouth daily.   DOXYCYCLINE  (VIBRA -TABS) 100 MG TABLET    Take 1 tablet (100 mg total) by mouth daily.   MULTIPLE VITAMINS-MINERALS (MULTIVITAMIN ADULTS PO)    Take by mouth daily.   OMEPRAZOLE  (PRILOSEC) 20 MG CAPSULE    Take 20 mg by mouth daily.   POTASSIUM 99 MG TABS    Take 99 mg by mouth daily.   PREDNISONE  (DELTASONE ) 5 MG TABLET    Take 5 mg by mouth daily.   QUETIAPINE  (SEROQUEL ) 100 MG TABLET    Take 1 tablet (100 mg total) by mouth at bedtime.   QUETIAPINE  (SEROQUEL ) 25 MG TABLET    Take 1-2 tablet by mouth daily at bedtime for sleep and anxiety   TADALAFIL  (CIALIS ) 10 MG TABLET    Take 1 tablet (10 mg total) by mouth every other day as needed for erectile dysfunction.   TADALAFIL  (CIALIS ) 5 MG TABLET    Take 5 mg by mouth daily.   TIZANIDINE (ZANAFLEX) 4 MG TABLET    Take 4 mg by mouth 3 (three) times daily.  Modified Medications   No medications on file  Discontinued Medications   No medications on file    Subjective: Discussed the use of AI scribe software for  clinical note transcription with the patient, who gave verbal consent to proceed.   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 He is accompanied by his wife.   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 Previous evaluations included testing for sarcoidosis and cancer, with bone marrow and lymph node biopsies performed.  He has psoriatic arthritis and takes prednisone , currently at 5 mg daily, with occasional increases to 15 mg. He experiences widespread joint pain in the neck, hips, and lower back. Systemic inflammation presents as thick, blistery, and painful oral mucosa, managed with Kenalog injections. He experiences chills, frequent headaches, lightheadedness, and syncope episodes. Nausea is present, and a CT scan confirmed diverticulitis. Denies chest pain, cough, SOB. Denies fevers.   Imaging has revealed lung nodules. He has a history of smoking THC, denies cigarette use, but vapes and consumes alcohol twice weekly. He experiences frequent urination due to an overactive bladder.  He experiences difficulty swallowing, possibly due to esophageal stricture, and intermittent lymph node swelling. He intentionally lost 80 pounds over six months through diet and exercise, which reduced leg swelling.  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  Lived in [place]  from July 1999 to June 2000.   Stopped following Rheumatology due to poor experience and wants to get back to care again and there may be chances of starting back on biologics. Recently had hepatitis, HIV and quantiferon checked and negative  Review of Systems: all systems reviewed with pertinent positives and negatives as listed above.   Past Medical History:  Diagnosis Date    Anxiety    Depression    GERD (gastroesophageal reflux disease)    Insomnia    Psoriasis    per patient    Psoriatic arthritis (HCC)    Past Surgical History:  Procedure Laterality Date   ANAL FISSURE REPAIR     BONE MARROW BIOPSY     CATARACT EXTRACTION, BILATERAL     10/28/2019, 12/16/2019    CYST EXCISION     on buttock    LYMPH NODE BIOPSY      Social History   Tobacco Use   Smoking status: Never    Passive exposure: Never   Smokeless tobacco: Never  Vaping Use   Vaping status: Every Day   Substances: Nicotine  Substance Use Topics   Alcohol use: Yes    Comment: light social drinker, will drink to intoxication occasionally   Drug use: Not Currently    Types: Marijuana, Other-see comments    Comment: mushrooms    Family History  Problem Relation Age of Onset   Heart Problems Mother    Alcohol abuse Father    Prostate cancer Father        anget orange   Autoimmune disease Sister    Dementia Maternal Grandmother     Allergies  Allergen Reactions   Sulfamethoxazole Rash    Other reaction(s): Fever (intolerance)    Health Maintenance  Topic Date Due   HIV Screening  Never done   Hepatitis C Screening  Never done   Hepatitis B Vaccines 19-59 Average Risk (1 of 3 - 19+ 3-dose series) Never done   Pneumococcal Vaccine: 50+ Years (1 of 1 - PCV) Never done   Zoster Vaccines- Shingrix  (2 of 2) 09/01/2023   INFLUENZA VACCINE  10/06/2023   COVID-19 Vaccine (1) 08/10/2024 (Originally 05/09/1975)   Colonoscopy  10/07/2026   DTaP/Tdap/Td (9 - Td or Tdap) 10/16/2033   HPV VACCINES  Aged Out   Meningococcal B Vaccine  Aged Out    Objective: BP 109/79   Pulse 93   Temp 98.2 F (36.8 C) (Temporal)   Ht 5' 11 (1.803 m)   Wt 200 lb (90.7 kg)   SpO2 96%   BMI 27.89 kg/m    Physical Exam Constitutional:      Appearance: Normal appearance.  HENT:     Head: Normocephalic and atraumatic.      Mouth: Mucous membranes are moist.  Eyes:    Conjunctiva/sclera:  Conjunctivae normal.     Pupils: Pupils are equal, round, and b/l symmetrical    Cardiovascular:     Rate and Rhythm: Normal rate and regular rhythm.     Heart sounds: s1s2  Pulmonary:     Effort: Pulmonary effort is normal.     Breath sounds: Normal breath sounds.   Abdominal:     General: Non distended     Palpations: soft. Non tender  Musculoskeletal:        General: Normal range of motion.  Skin:    General: Skin is warm and dry.     Comments:  Neurological:     General: grossly non focal     Mental Status: awake, alert and oriented to person, place, and time.   Psychiatric:        Mood and Affect: Mood normal.   Lab Results Lab Results  Component Value Date   WBC 5.8 06/28/2023   HGB 12.1 (L) 06/28/2023   HCT 37.0 (L) 06/28/2023   MCV 90 06/28/2023   PLT 208 06/28/2023    Lab Results  Component Value Date   CREATININE 1.24 07/07/2023   BUN 14 07/07/2023   NA 140 07/07/2023   K 4.9 07/07/2023   CL 103 07/07/2023   CO2 21 07/07/2023    Lab Results  Component Value Date   ALT 8 06/28/2023   AST 32 06/28/2023   ALKPHOS 70 06/28/2023   BILITOT 0.3 06/28/2023    Lab Results  Component Value Date   CHOL 190 06/28/2023   HDL 68 06/28/2023   LDLCALC 107 (H) 06/28/2023   TRIG 83 06/28/2023   CHOLHDL 2.8 06/28/2023   No results found for: LABRPR, RPRTITER No results found for: HIV1RNAQUANT, HIV1RNAVL, CD4TABS   Per prior notes  MRI lumbar spine 06/2010: anular rent, disc protrusion L4-L5, L5-S1  LABS 2011: Nml CBC, CMP, CPK, ESR and aldolase. Negative ANCA and ANA  Bone marrow biopsy 01/2003: Normocellular marrow, increased iron stores, no granulomata or lymphoproliferative process.  Lymph node biopsy 01/2004: benign lymph node  CT chest/abd/plevis 01/2003: Nonspecific minimal pleural thickening, minimal subcarinal nodes, mild splenic enlargement  Imaging 11/06/23 CT abdomen/pelvis 1. Innumerable calcified splenic granulomas, similar to  prior and favored sequela of prior infection. 2. Nonobstructive right renal stones measure up to 2-3 mm. 3. Scattered colonic diverticulosis without evidence of acute diverticulitis. 4. 7 mm left lower lobe pulmonary nodule, previously 5 mm. Scattered additional tiny pulmonary nodules for instance a 3 mm nodule in the lingula, common new from prior. Consider further evaluation by dedicated chest CT.    07/27/2004  CT  IMPRESSION:  1.   Tiny pericardial effusion and tiny bilateral pleural effusions.  2.   Mild mediastinal adenopathy and pleural nodules are no longer appreciated.  3.   Bilateral patchy atelectasis.  IMPRESSION:  1.   Adenopathy in the splenic hilum has markedly improved.  Mesenteric adenopathy is better.  Retroperitoneal adenopathy is not significantly changed.  This supports a benign etiology such as an inflammatory process.  2.   Splenic lesions with calcifications would support a granulomatous etiology such as fungal infection. IMPRESSION:  1.   Stranding about the perirectal fat and left side of the pelvis is worse however, the patient has had recent surgery in the anal region. Consider postoperative changes and/or inflammation.  2.   Low density lesion in the left iliopsoas muscle not changed over the course of eighteen months.  This would support benign etiology.    02/07/2003  CT  IMPRESSION  1. Nonspecific minimal pleural thickening, somewhat nodular appearance, posterior mid to lower lungs bilaterally.  Etiology and significance uncertain.  Consider follow-up.  2.  Minimally prominent subcarinal nodes, nonspecific.  3.  Tiny amount of pericardial fluid.  IMPRESSION  1. Mild splenic enlargement with diffusely abnormal splenic enhancement.  This is not felt to represent delayed splenic enhancement, and infiltrative processes such as lymphoma and infectious etiologies must be considered.   2.  Adenopathy at splenic hilum and left periaortic.  3.  Tiny right renal  calculi without hydronephrosis.  IMPRESSION  Slightly increased extraperitoneal infiltrative/edematous changes involving the left pelvic sidewall, presacral and perirectal regions, nonspecific.   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  - no need for further work up/tx currently - fu as needed/new concerns   # Psoriatic arthritis   - on prednisone  5mg  po daily  - fu with Rheumatology  # Pulmonary nodule  - Referred to pulmonary   I spent 60 minutes involved in face-to-face and non-face-to-face activities for this patient on the day of the visit. Professional time spent includes the following activities: Preparing to see the patient (review of tests), Obtaining and reviewing separately obtained history (Outside referral notes), Performing a medically appropriate examination and evaluation, referring and communicating with other health care professionals, Documenting clinical information in the EMR, Independently interpreting results (not separately reported), Communicating results to the patient/wife, Counseling and educating the patient/wife and Care coordination (not separately reported).   Of note, portions of this note may have been created with voice recognition software. While this note has been edited for accuracy, occasional wrong-word or 'sound-a-like' substitutions may have occurred due to the inherent limitations of voice recognition software.   Annalee Joseph, MD [facility] 11/08/2023, 3:00 PM

## 2023-11-09 ENCOUNTER — Ambulatory Visit (HOSPITAL_BASED_OUTPATIENT_CLINIC_OR_DEPARTMENT_OTHER): Payer: Self-pay | Admitting: Student

## 2023-11-09 NOTE — Progress Notes (Signed)
 Called patient:  Discussed imaging. No questions. When he saw ID, they referred him already to a pulmonary nodule clinic.

## 2023-11-14 ENCOUNTER — Encounter (HOSPITAL_BASED_OUTPATIENT_CLINIC_OR_DEPARTMENT_OTHER): Payer: Self-pay | Admitting: Student

## 2023-11-14 ENCOUNTER — Ambulatory Visit (HOSPITAL_BASED_OUTPATIENT_CLINIC_OR_DEPARTMENT_OTHER)
Admission: RE | Admit: 2023-11-14 | Discharge: 2023-11-14 | Disposition: A | Discharge status: Home / Self Care | Source: Ambulatory Visit | Attending: Student | Admitting: Radiology

## 2023-11-14 ENCOUNTER — Ambulatory Visit (HOSPITAL_BASED_OUTPATIENT_CLINIC_OR_DEPARTMENT_OTHER): Admitting: Student

## 2023-11-14 ENCOUNTER — Ambulatory Visit (HOSPITAL_BASED_OUTPATIENT_CLINIC_OR_DEPARTMENT_OTHER): Payer: Self-pay | Admitting: Student

## 2023-11-14 VITALS — BP 118/81 | HR 76 | Temp 97.8°F | Resp 16 | Ht 71.0 in | Wt 200.6 lb

## 2023-11-14 DIAGNOSIS — R350 Frequency of micturition: Secondary | ICD-10-CM

## 2023-11-14 DIAGNOSIS — N200 Calculus of kidney: Secondary | ICD-10-CM | POA: Diagnosis not present

## 2023-11-14 DIAGNOSIS — Z0389 Encounter for observation for other suspected diseases and conditions ruled out: Secondary | ICD-10-CM | POA: Diagnosis not present

## 2023-11-14 LAB — POCT URINALYSIS DIP (CLINITEK)
Bilirubin, UA: NEGATIVE
Blood, UA: NEGATIVE
Glucose, UA: NEGATIVE mg/dL
Ketones, POC UA: NEGATIVE mg/dL
Leukocytes, UA: NEGATIVE
Nitrite, UA: NEGATIVE
POC PROTEIN,UA: NEGATIVE
Spec Grav, UA: 1.015 (ref 1.010–1.025)
Urobilinogen, UA: 0.2 U/dL
pH, UA: 7 (ref 5.0–8.0)

## 2023-11-14 MED ORDER — TAMSULOSIN HCL 0.4 MG PO CAPS: 0.4000 mg | ORAL_CAPSULE | Freq: Every day | ORAL | 0 refills | Status: DC

## 2023-11-14 NOTE — Progress Notes (Unsigned)
 Acute Office Visit  Subjective:     Patient ID: Aaron Fowler, male    DOB: 1971-02-19, 53 y.o.   MRN: [id number]  Chief Complaint  Patient presents with   Urinary problem    Pt has had urinary frequency and back pain the past 3-4 days Not burning or pain when urinating. Noticed it a lot yesterday.     HPI  Discussed the use of AI scribe software for clinical note transcription with the patient, who gave verbal consent to proceed.  History of Present Illness   Aaron Fowler is a 53 year old male with a history of kidney stones who presents with right-sided back pain and increased urinary frequency.  He has been experiencing right-sided back pain that began as discomfort three to four days ago and intensified yesterday. The pain is localized to the right side of his back with no radiation. No burning or pain with urination is noted.  He reports increased urinary frequency, urinating once or twice an hour, including waking up every hour at night to urinate. He has a history of passing a small kidney stone previously, which was associated with back pain and a sensation of needing to defecate. A CT scan from the 22nd showed non-obstructive kidney stones measuring two to three millimeters without hydronephrosis.  He recalls a past episode of kidney failure that required a week-long hospitalization, which makes him cautious about kidney pain.  He is not experiencing dysuria but reports significant urinary frequency.      ROS Per HPI     Objective:    BP 118/81   Pulse 76   Temp 97.8 F (36.6 C) (Oral)   Resp 16   Ht 5' 11 (1.803 m)   Wt 200 lb 9.6 oz (91 kg)   SpO2 98%   BMI 27.98 kg/m    Physical Exam Constitutional:      General: He is not in acute distress.    Appearance: Normal appearance. He is not ill-appearing.  HENT:     Head: Normocephalic and atraumatic.     Right Ear: External ear normal.     Left Ear: External ear normal.     Nose: Nose normal.   Eyes:     Conjunctiva/sclera: Conjunctivae normal.  Cardiovascular:     Rate and Rhythm: Normal rate and regular rhythm.     Pulses: Normal pulses.     Heart sounds: Normal heart sounds. No murmur heard.    No friction rub.  Pulmonary:     Effort: Pulmonary effort is normal. No respiratory distress.     Breath sounds: Normal breath sounds. No wheezing, rhonchi or rales.  Abdominal:     General: There is no distension.     Tenderness: There is right CVA tenderness. There is no left CVA tenderness.  Skin:    General: Skin is warm and dry.     Coloration: Skin is not jaundiced or pale.  Neurological:     Mental Status: He is alert.  Psychiatric:        Mood and Affect: Mood normal.        Behavior: Behavior normal.     Results for orders placed or performed in visit on 11/14/23  POCT URINALYSIS DIP (CLINITEK)  Result Value Ref Range   Color, UA yellow yellow   Clarity, UA clear clear   Glucose, UA negative negative mg/dL   Bilirubin, UA negative negative   Ketones, POC UA negative negative mg/dL  Spec Grav, UA 1.015 1.010 - 1.025   Blood, UA negative negative   pH, UA 7.0 5.0 - 8.0   POC PROTEIN,UA negative negative, trace   Urobilinogen, UA 0.2 0.2 or 1.0 E.U./dL   Nitrite, UA Negative Negative   Leukocytes, UA Negative Negative        Assessment & Plan:   Assessment and Plan    Right kidney stone(s) with urinary frequency Acute right-sided kidney stone with associated urinary frequency. Pain localized to the right flank with no dysuria or hematuria. Recent CT showed non-obstructive stones measuring 2-3 mm with no hydronephrosis. Differential includes kidney stone versus UTI, though UA is negative. Increased urinary frequency, urinating once or twice an hour. Previous history of passing a small kidney stone. No signs of UTI or hydronephrosis on imaging. Risk of urosepsis discussed if fever, chills, or sweats develop. - Prescribe Flomax  to aid in stone passage. -  Order KUB x-ray to assess stone position. - Consider renal ultrasound if KUB is negative to assess for hydronephrosis. - Advise increased fluid intake. - Instruct to avoid tadalafil  while on Flomax . - Advise to monitor for signs of urosepsis and seek immediate care if symptoms develop. - Provide work note for absence due to medical condition.      Return if symptoms worsen or fail to improve.  Alfreddie Consalvo T Shimshon Narula, PA-C

## 2023-11-14 NOTE — Patient Instructions (Signed)
 It was nice to see you today!  If you have any problems before your next visit feel free to message me via MyChart (minor issues or questions) or call the office, otherwise you may reach out to schedule an office visit.  Thank you! Pau Banh, PA-C

## 2023-11-15 ENCOUNTER — Ambulatory Visit (HOSPITAL_BASED_OUTPATIENT_CLINIC_OR_DEPARTMENT_OTHER): Admitting: Student

## 2023-11-16 DIAGNOSIS — L821 Other seborrheic keratosis: Secondary | ICD-10-CM | POA: Diagnosis not present

## 2023-11-16 DIAGNOSIS — D229 Melanocytic nevi, unspecified: Secondary | ICD-10-CM | POA: Diagnosis not present

## 2023-11-16 DIAGNOSIS — L578 Other skin changes due to chronic exposure to nonionizing radiation: Secondary | ICD-10-CM | POA: Diagnosis not present

## 2023-11-16 DIAGNOSIS — L814 Other melanin hyperpigmentation: Secondary | ICD-10-CM | POA: Diagnosis not present

## 2023-11-16 DIAGNOSIS — L570 Actinic keratosis: Secondary | ICD-10-CM | POA: Diagnosis not present

## 2023-11-16 LAB — URINE CULTURE

## 2023-11-17 ENCOUNTER — Encounter (HOSPITAL_BASED_OUTPATIENT_CLINIC_OR_DEPARTMENT_OTHER): Payer: Self-pay | Admitting: Student

## 2023-11-21 ENCOUNTER — Other Ambulatory Visit (HOSPITAL_BASED_OUTPATIENT_CLINIC_OR_DEPARTMENT_OTHER): Payer: Self-pay | Admitting: Student

## 2023-11-21 DIAGNOSIS — R350 Frequency of micturition: Secondary | ICD-10-CM

## 2023-11-30 ENCOUNTER — Other Ambulatory Visit (HOSPITAL_BASED_OUTPATIENT_CLINIC_OR_DEPARTMENT_OTHER): Payer: Self-pay | Admitting: Student

## 2023-11-30 DIAGNOSIS — K219 Gastro-esophageal reflux disease without esophagitis: Secondary | ICD-10-CM

## 2023-11-30 MED ORDER — OMEPRAZOLE 20 MG PO CPDR
20.0000 mg | DELAYED_RELEASE_CAPSULE | Freq: Every day | ORAL | 3 refills | Status: AC
Start: 2023-11-30 — End: ?

## 2023-12-01 ENCOUNTER — Ambulatory Visit (HOSPITAL_BASED_OUTPATIENT_CLINIC_OR_DEPARTMENT_OTHER): Admitting: Student

## 2023-12-04 ENCOUNTER — Ambulatory Visit

## 2023-12-04 VITALS — BP 130/78 | HR 74 | Temp 97.6°F | Ht 71.0 in | Wt 202.2 lb

## 2023-12-04 DIAGNOSIS — Z7952 Long term (current) use of systemic steroids: Secondary | ICD-10-CM

## 2023-12-04 DIAGNOSIS — L408 Other psoriasis: Secondary | ICD-10-CM | POA: Diagnosis not present

## 2023-12-04 DIAGNOSIS — R918 Other nonspecific abnormal finding of lung field: Secondary | ICD-10-CM

## 2023-12-04 DIAGNOSIS — R0602 Shortness of breath: Secondary | ICD-10-CM

## 2023-12-04 DIAGNOSIS — D7389 Other diseases of spleen: Secondary | ICD-10-CM

## 2023-12-04 DIAGNOSIS — Z87891 Personal history of nicotine dependence: Secondary | ICD-10-CM

## 2023-12-04 NOTE — Assessment & Plan Note (Addendum)
 Patient on Skyrizi through dermatology Orders:   Pulmonary Function Test; Future

## 2023-12-04 NOTE — Progress Notes (Signed)
 New Patient Pulmonology Office Visit   Subjective:  Patient ID: Aaron Fowler, male    DOB: March 03, 1971  MRN: [id number]  Referred by: Dea Shiner, MD  CC:  Chief Complaint  Patient presents with   Lung Lesion    HPI Aaron Fowler is a 53 y.o. male with left lung nodules.   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  It also showed tiny pulmonary nodules in the lingula measuring up to 3 mm.  Hence pulmonary referral was placed  Patient denies much pulmonary symptoms currently except occasional shortness of breath with exertion, which is chronic  He has history of anxiety, depression, GERD, psoriatic arthritis on chronic prednisone  5 mg. Patient reports significant illness between 2006 and 2008, when he underwent extensive workup including bone marrow biopsy.  He believes it revealed something autoimmune but official diagnosis was not reached.  He reports he tested negative for sarcoidosis at that time. He has been on 5 mg prednisone  through PCP for several years now.  He reports that he feels  sick when he discontinues steroid. He is now on skyrizi through dermatology  Reports chronic arthritis related to psoriasis.  Reports skin changes. Reports chronic shortness of breath on exertion  Reports 80 pound weight loss over 6 months intentionally.  However he has gained back 20 pounds with already  Remote 15-year smoking history.  Quit smoking more than 15 years ago     ROS Review of symptoms negative except mentioned above   Allergies: Sulfamethoxazole  Current Outpatient Medications:    cariprazine  (VRAYLAR ) 1.5 MG capsule, Take 1 capsule (1.5 mg total) by mouth daily., Disp: 30 capsule, Rfl: 3   clonazePAM  (KLONOPIN ) 0.5 MG tablet, Take 1 tablet (0.5 mg total) by mouth daily. (Patient taking differently: Take 0.5 mg by mouth as needed.), Disp: 30  tablet, Rfl: 3   Multiple Vitamins-Minerals (MULTIVITAMIN ADULTS PO), Take by mouth daily. (Patient taking differently: Take 1 tablet by mouth as needed.), Disp: , Rfl:    omeprazole  (PRILOSEC) 20 MG capsule, Take 1 capsule (20 mg total) by mouth daily., Disp: 90 capsule, Rfl: 3   predniSONE  (DELTASONE ) 5 MG tablet, Take 5 mg by mouth daily., Disp: , Rfl:    SKYRIZI PEN 150 MG/ML pen, Inject 1 mL into the skin every 30 (thirty) days., Disp: , Rfl:    tadalafil  (CIALIS ) 10 MG tablet, Take 1 tablet (10 mg total) by mouth every other day as needed for erectile dysfunction., Disp: 15 tablet, Rfl: 2   tamsulosin  (FLOMAX ) 0.4 MG CAPS capsule, Take 1 capsule (0.4 mg total) by mouth daily., Disp: 30 capsule, Rfl: 0   tiZANidine (ZANAFLEX) 4 MG tablet, Take 4 mg by mouth 3 (three) times daily., Disp: , Rfl:    UNABLE TO FIND, daily. Med Name: TRIPLE ROSACEA CREAM AZELAIC ACID15%/METRONIDAZOLE 1%/IVERMECTIN 1% CREAM  FROM DERM FOR NOSE, Disp: , Rfl:    Calcium Carbonate (CALCIUM 500 PO), Take 500 mg by mouth daily. (Patient not taking: Reported on 12/04/2023), Disp: , Rfl:  Past Medical History:  Diagnosis Date   Anxiety    Depression    GERD (gastroesophageal reflux disease)    Insomnia    Psoriasis    per patient    Psoriatic arthritis [facility])    Past Surgical History:  Procedure Laterality Date   ANAL FISSURE REPAIR  BONE MARROW BIOPSY     CATARACT EXTRACTION, BILATERAL     10/28/2019, 12/16/2019    CYST EXCISION     on buttock    LYMPH NODE BIOPSY     Family History  Problem Relation Age of Onset   Heart Problems Mother    Alcohol abuse Father    Prostate cancer Father        anget orange   Autoimmune disease Sister    Dementia Maternal Grandmother    Social History   Socioeconomic History   Marital status: Married    Spouse name: Leonce   Number of children: 4   Years of education: 12   Highest education level: GED or equivalent  Occupational History   Occupation: [employer]ist     Comment: [employer]  Tobacco Use   Smoking status: Never    Passive exposure: Never   Smokeless tobacco: Never  Vaping Use   Vaping status: Every Day   Substances: Nicotine  Substance and Sexual Activity   Alcohol use: Yes    Comment: light social drinker, will drink to intoxication occasionally   Drug use: Not Currently    Types: Marijuana, Other-see comments    Comment: mushrooms   Sexual activity: Yes  Other Topics Concern   Not on file  Social History Narrative   2 adopted children    Social Drivers of Health   Financial Resource Strain: Not on file  Food Insecurity: No Food Insecurity (06/28/2023)   Hunger Vital Sign    Worried About Running Out of Food in the Last Year: Never true    Ran Out of Food in the Last Year: Never true  Transportation Needs: No Transportation Needs (06/28/2023)   PRAPARE - Administrator, Civil Service (Medical): No    Lack of Transportation (Non-Medical): No  Physical Activity: Not on file  Stress: Not on file  Social Connections: Not on file  Intimate Partner Violence: Not At Risk (06/28/2023)   Humiliation, Afraid, Rape, and Kick questionnaire    Fear of Current or Ex-Partner: No    Emotionally Abused: No    Physically Abused: No    Sexually Abused: No         Objective:  BP 130/78   Pulse 74   Temp 97.6 F (36.4 C) (Temporal)   Ht 5' 11 (1.803 m)   Wt 202 lb 3.2 oz (91.7 kg)   SpO2 98%   BMI 28.20 kg/m    Physical Exam Constitutional:      General: He is not in acute distress.    Appearance: Normal appearance.  HENT:     Mouth/Throat:     Mouth: Mucous membranes are moist.  Cardiovascular:     Rate and Rhythm: Normal rate.  Pulmonary:     Effort: No respiratory distress.     Breath sounds: No wheezing or rales.  Musculoskeletal:     Right lower leg: No edema.     Left lower leg: No edema.  Skin:    General: Skin is warm.     Findings: Bruising and rash present.  Neurological:     Mental  Status: He is alert and oriented to person, place, and time.  Psychiatric:        Mood and Affect: Mood normal.     Diagnostic Review:    Pft     No data to display            CT chest 11/2023: 1. Innumerable  calcified splenic granulomas, similar to prior and favored sequela of prior infection. 2. Nonobstructive right renal stones measure up to 2-3 mm. 3. Scattered colonic diverticulosis without evidence of acute diverticulitis. 4. 7 mm left lower lobe pulmonary nodule, previously 5 mm. Scattered additional tiny pulmonary nodules for instance a 3 mm nodule in the lingula, common new from prior.  Reviewed referral notes by Dr. Dea    Assessment & Plan:   Assessment & Plan Lung nodules Appear benign nature with about 3 mm reported growth over 7 years, could be related to the resolution of CT chest Will need dedicated CT chest Previously extensively worked up for sarcoidosis, fungal infections, blood disorders as per patient. At this point seems to be dependent on steroids and symptoms seem to be well-controlled Will follow-up on CT chest and make further plans regarding follow-up of this nodule Discussed potential differentials like sarcoidosis, inflammatory nodules, endemic fungal infections at length with the patient Orders:   Pulmonary Function Test; Future   CT CHEST HIGH RESOLUTION; Future  Current chronic use of systemic steroids Discussed potential side effects of chronic systemic steroid use Patient reports that he was unable to be weaned off steroid in the past. Patient now on Skyrizi through dermatology Orders:   CT CHEST HIGH RESOLUTION; Future  Other psoriasis Patient on Skyrizi through dermatology Orders:   Pulmonary Function Test; Future  Splenic calcification Recently evaluated by ID.  Agree that this appears chronic Orders:   Pulmonary Function Test; Future   CT CHEST HIGH RESOLUTION; Future  Former smoker Discussed the symptoms,  etiology, pathophysiology, diagnostic test, treatment, flare ups,  prognosis of copd Patient denies COPD like symptoms Will schedule PFT for chronic shortness of breath Orders:   Pulmonary Function Test; Future  SOB (shortness of breath)  Orders:   Pulmonary Function Test; Future   CT CHEST HIGH RESOLUTION; Future   I personally spent a total of 45 minutes in the care of the patient today including performing a medically appropriate exam/evaluation, placing orders, documenting clinical information in the EHR, and independently interpreting results.   Thank you for the opportunity to take part in the care of Aaron Fowler   Return in about 3 weeks (around 12/25/2023).   Khyle Goodell Pleas, MD [facility] Office: [phone]

## 2023-12-04 NOTE — Patient Instructions (Signed)
 It was a pleasure to see you today. Your pulmonary function test will be scheduled closer to your next follow up visit. Your will receive a call for your CT chest scheduling. We will see you in few weeks to review those results

## 2023-12-08 ENCOUNTER — Encounter

## 2023-12-08 ENCOUNTER — Ambulatory Visit

## 2023-12-08 DIAGNOSIS — L408 Other psoriasis: Secondary | ICD-10-CM

## 2023-12-08 DIAGNOSIS — R918 Other nonspecific abnormal finding of lung field: Secondary | ICD-10-CM | POA: Diagnosis not present

## 2023-12-08 DIAGNOSIS — Z87891 Personal history of nicotine dependence: Secondary | ICD-10-CM

## 2023-12-08 DIAGNOSIS — D7389 Other diseases of spleen: Secondary | ICD-10-CM

## 2023-12-08 DIAGNOSIS — R0602 Shortness of breath: Secondary | ICD-10-CM

## 2023-12-08 LAB — PULMONARY FUNCTION TEST
DL/VA % pred: 107 %
DL/VA: 4.66 ml/min/mmHg/L
DLCO unc % pred: 84 %
DLCO unc: 26.99 ml/min/mmHg
FEF 25-75 Post: 5.05 L/s
FEF 25-75 Pre: 4.56 L/s
FEF2575-%Change-Post: 10 %
FEF2575-%Pred-Post: 138 %
FEF2575-%Pred-Pre: 124 %
FEV1-%Change-Post: 1 %
FEV1-%Pred-Post: 89 %
FEV1-%Pred-Pre: 88 %
FEV1-Post: 3.8 L
FEV1-Pre: 3.75 L
FEV1FVC-%Change-Post: 4 %
FEV1FVC-%Pred-Pre: 109 %
FEV6-%Change-Post: -2 %
FEV6-%Pred-Post: 80 %
FEV6-%Pred-Pre: 82 %
FEV6-Post: 4.31 L
FEV6-Pre: 4.41 L
FEV6FVC-%Change-Post: 0 %
FEV6FVC-%Pred-Post: 103 %
FEV6FVC-%Pred-Pre: 103 %
FVC-%Change-Post: -2 %
FVC-%Pred-Post: 78 %
FVC-%Pred-Pre: 80 %
FVC-Post: 4.31 L
FVC-Pre: 4.42 L
Post FEV1/FVC ratio: 88 %
Post FEV6/FVC ratio: 100 %
Pre FEV1/FVC ratio: 85 %
Pre FEV6/FVC Ratio: 100 %
RV % pred: 89 %
RV: 2.01 L
TLC % pred: 84 %
TLC: 6.42 L

## 2023-12-08 NOTE — Progress Notes (Signed)
 Full pft performed today

## 2023-12-08 NOTE — Patient Instructions (Signed)
 Full pft performed today

## 2023-12-12 ENCOUNTER — Ambulatory Visit (HOSPITAL_BASED_OUTPATIENT_CLINIC_OR_DEPARTMENT_OTHER): Admission: RE | Admit: 2023-12-12 | Discharge: 2023-12-12 | Disposition: A | Source: Ambulatory Visit

## 2023-12-12 DIAGNOSIS — D7389 Other diseases of spleen: Secondary | ICD-10-CM

## 2023-12-12 DIAGNOSIS — R918 Other nonspecific abnormal finding of lung field: Secondary | ICD-10-CM

## 2023-12-12 DIAGNOSIS — Z7952 Long term (current) use of systemic steroids: Secondary | ICD-10-CM | POA: Diagnosis not present

## 2023-12-12 DIAGNOSIS — R0602 Shortness of breath: Secondary | ICD-10-CM

## 2023-12-15 ENCOUNTER — Ambulatory Visit: Payer: Self-pay

## 2023-12-15 ENCOUNTER — Ambulatory Visit

## 2023-12-15 NOTE — Progress Notes (Signed)
 Called and spoke to pt- informed of CT results per Dr. Pleas. Pt verbalized understanding, NFN

## 2023-12-23 ENCOUNTER — Other Ambulatory Visit (HOSPITAL_BASED_OUTPATIENT_CLINIC_OR_DEPARTMENT_OTHER): Payer: Self-pay | Admitting: Student

## 2023-12-23 DIAGNOSIS — R350 Frequency of micturition: Secondary | ICD-10-CM

## 2023-12-25 ENCOUNTER — Telehealth (HOSPITAL_BASED_OUTPATIENT_CLINIC_OR_DEPARTMENT_OTHER): Payer: Self-pay

## 2023-12-25 NOTE — Telephone Encounter (Signed)
 Pt said he only takes flomax  when he has a kidney stone. He did not need a refill. He has plenty right now. Updated directions on flomax  in medlist.

## 2024-01-03 ENCOUNTER — Ambulatory Visit

## 2024-01-03 ENCOUNTER — Other Ambulatory Visit: Payer: Self-pay

## 2024-01-03 VITALS — BP 102/70 | HR 88 | Temp 98.4°F | Ht 72.0 in | Wt 200.0 lb

## 2024-01-03 DIAGNOSIS — J849 Interstitial pulmonary disease, unspecified: Secondary | ICD-10-CM

## 2024-01-03 DIAGNOSIS — L408 Other psoriasis: Secondary | ICD-10-CM | POA: Diagnosis not present

## 2024-01-03 DIAGNOSIS — Z87891 Personal history of nicotine dependence: Secondary | ICD-10-CM

## 2024-01-03 DIAGNOSIS — I2510 Atherosclerotic heart disease of native coronary artery without angina pectoris: Secondary | ICD-10-CM

## 2024-01-03 DIAGNOSIS — K219 Gastro-esophageal reflux disease without esophagitis: Secondary | ICD-10-CM | POA: Insufficient documentation

## 2024-01-03 DIAGNOSIS — F419 Anxiety disorder, unspecified: Secondary | ICD-10-CM | POA: Insufficient documentation

## 2024-01-03 DIAGNOSIS — R918 Other nonspecific abnormal finding of lung field: Secondary | ICD-10-CM

## 2024-01-03 DIAGNOSIS — D7389 Other diseases of spleen: Secondary | ICD-10-CM

## 2024-01-03 DIAGNOSIS — G4700 Insomnia, unspecified: Secondary | ICD-10-CM | POA: Insufficient documentation

## 2024-01-03 DIAGNOSIS — F32A Depression, unspecified: Secondary | ICD-10-CM | POA: Insufficient documentation

## 2024-01-03 DIAGNOSIS — Z7952 Long term (current) use of systemic steroids: Secondary | ICD-10-CM | POA: Diagnosis not present

## 2024-01-03 DIAGNOSIS — L409 Psoriasis, unspecified: Secondary | ICD-10-CM | POA: Insufficient documentation

## 2024-01-03 DIAGNOSIS — L4050 Arthropathic psoriasis, unspecified: Secondary | ICD-10-CM | POA: Insufficient documentation

## 2024-01-03 NOTE — Progress Notes (Signed)
 New Patient Pulmonology Office Visit   Initial HPI 11/2023  Patient ID: Aaron Fowler, male    DOB: 1971/01/25  MRN: [id number]  Referred by: Pleas Newborn, MD  CC:  Chief Complaint  Patient presents with   Follow-up    Patient denies sob. Pft result    HPI Aaron Fowler is a 53 y.o. male with left lung nodules.   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  It also showed tiny pulmonary nodules in the lingula measuring up to 3 mm.  Hence pulmonary referral was placed  Patient denies much pulmonary symptoms currently except occasional shortness of breath with exertion, which is chronic  He has history of anxiety, depression, GERD, psoriatic arthritis on chronic prednisone  5 mg. Patient reports significant illness between 2006 and 2008, when he underwent extensive workup including bone marrow biopsy.  He believes it revealed something autoimmune but official diagnosis was not reached.  He reports he tested negative for sarcoidosis at that time. He has been on 5 mg prednisone  through PCP for several years now.  He reports that he feels  sick when he discontinues steroid. He is now on skyrizi through dermatology  Reports chronic arthritis related to psoriasis.  Reports skin changes. Reports chronic shortness of breath on exertion  Reports 80 pound weight loss over 6 months intentionally.  However he has gained back 20 pounds with already  Remote 15-year smoking history.  Quit smoking more than 15 years ago    Subjective:   No new pulm symptoms since last visit Completed PFT, CT scan Here to review results   ROS Review of symptoms negative except mentioned above   Allergies: Sulfamethoxazole  Current Outpatient Medications:    cariprazine  (VRAYLAR ) 1.5 MG capsule, Take 1 capsule (1.5 mg total) by mouth daily., Disp: 30 capsule, Rfl: 3   clonazePAM   (KLONOPIN ) 0.5 MG tablet, Take 1 tablet (0.5 mg total) by mouth daily., Disp: 30 tablet, Rfl: 3   Multiple Vitamins-Minerals (MULTIVITAMIN ADULTS PO), Take by mouth daily., Disp: , Rfl:    omeprazole  (PRILOSEC) 20 MG capsule, Take 1 capsule (20 mg total) by mouth daily., Disp: 90 capsule, Rfl: 3   predniSONE  (DELTASONE ) 5 MG tablet, Take 5 mg by mouth daily., Disp: , Rfl:    SKYRIZI PEN 150 MG/ML pen, Inject 1 mL into the skin every 30 (thirty) days., Disp: , Rfl:    tadalafil  (CIALIS ) 10 MG tablet, Take 1 tablet (10 mg total) by mouth every other day as needed for erectile dysfunction., Disp: 15 tablet, Rfl: 2   Calcium Carbonate (CALCIUM 500 PO), Take 500 mg by mouth daily. (Patient not taking: Reported on 01/03/2024), Disp: , Rfl:    tamsulosin  (FLOMAX ) 0.4 MG CAPS capsule, Take 1 capsule (0.4 mg total) by mouth daily. (Patient not taking: Reported on 01/03/2024), Disp: 30 capsule, Rfl: 0   tiZANidine (ZANAFLEX) 4 MG tablet, Take 4 mg by mouth 3 (three) times daily. (Patient not taking: Reported on 01/03/2024), Disp: , Rfl:    UNABLE TO FIND, daily. Med Name: TRIPLE ROSACEA CREAM AZELAIC ACID15%/METRONIDAZOLE 1%/IVERMECTIN 1% CREAM  FROM DERM FOR NOSE (Patient not taking: Reported on 01/03/2024), Disp: , Rfl:  Past Medical History:  Diagnosis Date   Anxiety    Depression    GERD (gastroesophageal reflux disease)    Insomnia    Psoriasis  per patient    Psoriatic arthritis [facility])    Past Surgical History:  Procedure Laterality Date   ANAL FISSURE REPAIR     BONE MARROW BIOPSY     CATARACT EXTRACTION, BILATERAL     10/28/2019, 12/16/2019    CYST EXCISION     on buttock    LYMPH NODE BIOPSY     Family History  Problem Relation Age of Onset   Heart Problems Mother    Alcohol abuse Father    Prostate cancer Father        anget orange   Autoimmune disease Sister    Dementia Maternal Grandmother    Social History   Socioeconomic History   Marital status: Married    Spouse name:  Leonce   Number of children: 4   Years of education: 12   Highest education level: GED or equivalent  Occupational History   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    Comment: [employer]  Tobacco Use   Smoking status: Never    Passive exposure: Never   Smokeless tobacco: Never  Vaping Use   Vaping status: Every Day   Substances: Nicotine  Substance and Sexual Activity   Alcohol use: Yes    Comment: light social drinker, will drink to intoxication occasionally   Drug use: Not Currently    Types: Marijuana, Other-see comments    Comment: mushrooms   Sexual activity: Yes  Other Topics Concern   Not on file  Social History Narrative   2 adopted children    Social Drivers of Health   Financial Resource Strain: Not on file  Food Insecurity: No Food Insecurity (06/28/2023)   Hunger Vital Sign    Worried About Running Out of Food in the Last Year: Never true    Ran Out of Food in the Last Year: Never true  Transportation Needs: No Transportation Needs (06/28/2023)   PRAPARE - Administrator, Civil Service (Medical): No    Lack of Transportation (Non-Medical): No  Physical Activity: Not on file  Stress: Not on file  Social Connections: Not on file  Intimate Partner Violence: Not At Risk (06/28/2023)   Humiliation, Afraid, Rape, and Kick questionnaire    Fear of Current or Ex-Partner: No    Emotionally Abused: No    Physically Abused: No    Sexually Abused: No         Objective:  BP 102/70   Pulse 88   Temp 98.4 F (36.9 C) (Oral)   Ht 6' (1.829 m)   Wt 200 lb (90.7 kg)   SpO2 96%   BMI 27.12 kg/m    Physical Exam Constitutional:      General: He is not in acute distress.    Appearance: Normal appearance.  HENT:     Mouth/Throat:     Mouth: Mucous membranes are moist.  Cardiovascular:     Rate and Rhythm: Normal rate.  Pulmonary:     Effort: No respiratory distress.     Breath sounds: No wheezing or rales.  Musculoskeletal:     Right lower leg: No edema.      Left lower leg: No edema.  Skin:    General: Skin is warm.     Findings: Bruising and rash present.  Neurological:     Mental Status: He is alert and oriented to person, place, and time.  Psychiatric:        Mood and Affect: Mood normal.     Diagnostic Review:    Pft  Latest Ref Rng & Units 12/08/2023   12:50 PM  PFT Results  FVC-Pre L 4.42  P  FVC-Predicted Pre % 80  P  FVC-Post L 4.31  P  FVC-Predicted Post % 78  P  Pre FEV1/FVC % % 85  P  Post FEV1/FCV % % 88  P  FEV1-Pre L 3.75  P  FEV1-Predicted Pre % 88  P  FEV1-Post L 3.80  P  DLCO uncorrected ml/min/mmHg 26.99  P  DLCO UNC% % 84  P  DLVA Predicted % 107  P  TLC L 6.42  P  TLC % Predicted % 84  P  RV % Predicted % 89  P    P Preliminary result  No obstruction, no significant bronchodilator response Total lung capacity is within low normal limits Diffusion capacity is within normal  Ct chest 12/2023 Left lung nodule stable around 6-7 mm Very minimal subpleural reticular densities, with a slight basilar predominance and persistence on prone imaging. There may be scattered trace traction bronchiolectasis. No pleural fluid. Airway is unremarkable. Mild air trapping.    CT chest 11/2023: 1. Innumerable calcified splenic granulomas, similar to prior and favored sequela of prior infection. 2. Nonobstructive right renal stones measure up to 2-3 mm. 3. Scattered colonic diverticulosis without evidence of acute diverticulitis. 4. 7 mm left lower lobe pulmonary nodule, previously 5 mm. Scattered additional tiny pulmonary nodules for instance a 3 mm nodule in the lingula, common new from prior.  Reviewed referral notes by Dr. Dea    Assessment & Plan:   Assessment & Plan Lung nodules Appear benign nature with about 3 mm reported growth over 7 years, could be related to the resolution of CT chest dedicated CT chest Oct 2025 shows stable nodule Previously extensively worked up for sarcoidosis,  fungal infections, blood disorders as per patient.    Former smoker Doesn't have copd like symptoms Orders:   Pulmonary function test; Future  Long term current use of systemic steroids On 5 mg prednisone  daily Orders:   Pulmonary function test; Future  Other psoriasis On skyrizi  Follows with dermatology Orders:   Pulmonary function test; Future  Splenic lesion Chronic calcification     Coronary artery calcification Pt concerned with that finding Denies chest pain Given hx of smoking, chronic steroids use, age will refer to cardiology to r/o CAD and cardiac health maintenance  Orders:   Ambulatory referral to Cardiology   Pulmonary function test; Future  ILD (interstitial lung disease) (HCC) Discussed the symptoms, etiology, pathophysiology, diagnostic test, treatment, flare ups,  prognosis of ILD. Doesn't look like IPF. Can't rule out early NSIP Discussed the symptoms of worsening ILD or ILD flare ups Minimal scarring at the bases TLC 84% Likely related to psoriasis. Pt already on skyrizi, (risankizumab) is an interleukin-23 (IL-23) inhibitor, which is being currently explored for ILD related to certain conditions Will rule out other CTD PFT in 6 months Will follow up in future with PFT, Ct chest as needed Orders:   ANA   Rheumatoid factor   Cyclic citrul peptide antibody, IgG   ANCA screen with reflex titer   Anti-DNA antibody, double-stranded   Anti-Jo-1 Ab (RDL)   Anti-scleroderma antibody   Pulmonary function test; Future   I personally spent a total of 40 minutes in the care of the patient today including performing a medically appropriate exam/evaluation, placing orders, documenting clinical information in the EHR, and independently interpreting results.   Thank you for the opportunity to take part  in the care of Aaron Fowler   Return in about 6 months (around 07/03/2024).   Baran Kuhrt Pleas, MD [facility] Office:  [phone]

## 2024-01-03 NOTE — Patient Instructions (Signed)
 It was a pleasure to see you today. Please have your labs drawn in our clinic today. Your pulmonary function test will be scheduled closer to your next follow up visit.

## 2024-01-03 NOTE — Assessment & Plan Note (Addendum)
 On 5 mg prednisone  daily Orders:   Pulmonary function test; Future

## 2024-01-03 NOTE — Assessment & Plan Note (Addendum)
 On skyrizi  Follows with dermatology Orders:   Pulmonary function test; Future

## 2024-01-03 NOTE — Assessment & Plan Note (Addendum)
 Chronic calcification

## 2024-01-04 ENCOUNTER — Ambulatory Visit

## 2024-01-04 ENCOUNTER — Other Ambulatory Visit (HOSPITAL_BASED_OUTPATIENT_CLINIC_OR_DEPARTMENT_OTHER): Payer: Self-pay

## 2024-01-04 VITALS — BP 100/60 | HR 75 | Ht 72.0 in | Wt 207.0 lb

## 2024-01-04 DIAGNOSIS — R0609 Other forms of dyspnea: Secondary | ICD-10-CM | POA: Insufficient documentation

## 2024-01-04 DIAGNOSIS — R42 Dizziness and giddiness: Secondary | ICD-10-CM | POA: Insufficient documentation

## 2024-01-04 DIAGNOSIS — I2510 Atherosclerotic heart disease of native coronary artery without angina pectoris: Secondary | ICD-10-CM | POA: Insufficient documentation

## 2024-01-04 HISTORY — DX: Dizziness and giddiness: R42

## 2024-01-04 HISTORY — DX: Atherosclerotic heart disease of native coronary artery without angina pectoris: I25.10

## 2024-01-04 HISTORY — DX: Other forms of dyspnea: R06.09

## 2024-01-04 MED ORDER — IVABRADINE HCL 7.5 MG PO TABS
ORAL_TABLET | ORAL | 0 refills | Status: DC
  Filled 2024-01-04: qty 2, 1d supply, fill #0

## 2024-01-04 MED ORDER — SHINGRIX 50 MCG/0.5ML IM SUSR
INTRAMUSCULAR | 0 refills | Status: DC
  Filled 2024-01-04: qty 1, 1d supply, fill #0

## 2024-01-04 MED ORDER — ROSUVASTATIN CALCIUM 10 MG PO TABS: 10.0000 mg | ORAL_TABLET | ORAL | 3 refills | Status: DC

## 2024-01-04 NOTE — Assessment & Plan Note (Signed)
 Episodes of postural lightheadedness with syncope.  Reports 3-4 episodes of syncope over the past couple years.  He does have soft blood pressures to begin with at baseline and has been using Cialis  regularly almost on a daily basis.  Likely contributing to his symptoms.  Would recommend discontinuing Cialis . Recommended he stay well-hydrated. Advised precaution before changing position, such as to move his legs and calves before standing up.  His symptoms do not appear to be related to cardiac arrhythmias.

## 2024-01-04 NOTE — Patient Instructions (Addendum)
 Medication Instructions:  Your physician has recommended you make the following change in your medication:   Start taking 81 mg coated aspirin daily  Start Crestor 10 mg 2 times a week  Decrease Cialis  use and discuss with your primary care provider  *If you need a refill on your cardiac medications before your next appointment, please call your pharmacy*   Lab Work: Your physician recommends that you have a BMP today in the office.  If you have labs (blood work) drawn today and your tests are completely normal, you will receive your results only by: MyChart Message (if you have MyChart) OR A paper copy in the mail If you have any lab test that is abnormal or we need to change your treatment, we will call you to review the results.  Testing/Procedures: Your physician has requested that you have an echocardiogram. Echocardiography is a painless test that uses sound waves to create images of your heart. It provides your doctor with information about the size and shape of your heart and how well your heart's chambers and valves are working. This procedure takes approximately one hour. There are no restrictions for this procedure. Please do NOT wear cologne, perfume, aftershave, or lotions (deodorant is allowed). Please arrive 15 minutes prior to your appointment time.  Please note: We ask at that you not bring children with you during ultrasound (echo/ vascular) testing. Due to room size and safety concerns, children are not allowed in the ultrasound rooms during exams. Our front office staff cannot provide observation of children in our lobby area while testing is being conducted. An adult accompanying a patient to their appointment will only be allowed in the ultrasound room at the discretion of the ultrasound technician under special circumstances. We apologize for any inconvenience.    Your cardiac CT will be scheduled at one of the below locations:   [facility] [address] [place], [place] [phone]   Please follow these instructions carefully (unless otherwise directed):  An IV will be required for this test and Nitroglycerin will be given.  Hold all erectile dysfunction medications at least 3 days (72 hrs) prior to test. (Ie viagra, cialis , sildenafil, tadalafil , etc)   On the Night Before the Test: Be sure to Drink plenty of water. Do not consume any caffeinated/decaffeinated beverages or chocolate 12 hours prior to your test. Do not take any antihistamines 12 hours prior to your test.  On the Day of the Test: Drink plenty of water until 1 hour prior to the test. Do not eat any food 1 hour prior to test. You may take your regular medications prior to the test.  Take Ivabradine (Corlanor) 15 mg two hours prior to test.      After the Test: Drink plenty of water. After receiving IV contrast, you may experience a mild flushed feeling. This is normal. On occasion, you may experience a mild rash up to 24 hours after the test. This is not dangerous. If this occurs, you can take Benadryl 25 mg, Zyrtec, Claritin, or Allegra and increase your fluid intake. (Patients taking Tikosyn should avoid Benadryl, and may take Zyrtec, Claritin, or Allegra) If you experience trouble breathing, this can be serious. If it is severe call 911 IMMEDIATELY. If it is mild, please call our office.  We will call to schedule your test 2-4 weeks out understanding that some insurance companies will need an authorization prior to the service being performed.   For more information and frequently asked  questions, please visit our website : [email or web address]  For non-scheduling related questions, please contact the cardiac imaging nurse navigator should you have any questions/concerns: Cardiac Imaging Nurse Navigators Direct Office Dial: [phone]   For scheduling needs, including cancellations and rescheduling, please call Brittany,  [phone].  Follow-Up: At [facility], you and your health needs are our priority.  As part of our continuing mission to provide you with exceptional heart care, we have created designated Provider Care Teams.  These Care Teams include your primary Cardiologist (physician) and Advanced Practice Providers (APPs -  Physician Assistants and Nurse Practitioners) who all work together to provide you with the care you need, when you need it.  We recommend signing up for the patient portal called MyChart.  Sign up information is provided on this After Visit Summary.  MyChart is used to connect with patients for Virtual Visits (Telemedicine).  Patients are able to view lab/test results, encounter notes, upcoming appointments, etc.  Non-urgent messages can be sent to your provider as well.   To learn more about what you can do with MyChart, go to [email or web address].    Your next appointment:   3 month(s)  The format for your next appointment:   In Person  Provider:   Alean Madireddy, MD   Other Instructions Echocardiogram An echocardiogram is a test that uses sound waves (ultrasound) to produce images of the heart. Images from an echocardiogram can provide important information about: Heart size and shape. The size and thickness and movement of your heart's walls. Heart muscle function and strength. Heart valve function or if you have stenosis. Stenosis is when the heart valves are too narrow. If blood is flowing backward through the heart valves (regurgitation). A tumor or infectious growth around the heart valves. Areas of heart muscle that are not working well because of poor blood flow or injury from a heart attack. Aneurysm detection. An aneurysm is a weak or damaged part of an artery wall. The wall bulges out from the normal force of blood pumping through the body. Tell a health care provider about: Any allergies you have. All medicines you are taking, including vitamins,  herbs, eye drops, creams, and over-the-counter medicines. Any blood disorders you have. Any surgeries you have had. Any medical conditions you have. Whether you are pregnant or may be pregnant. What are the risks? Generally, this is a safe test. However, problems may occur, including an allergic reaction to dye (contrast) that may be used during the test. What happens before the test? No specific preparation is needed. You may eat and drink normally. What happens during the test? You will take off your clothes from the waist up and put on a hospital gown. Electrodes or electrocardiogram (ECG)patches may be placed on your chest. The electrodes or patches are then connected to a device that monitors your heart rate and rhythm. You will lie down on a table for an ultrasound exam. A gel will be applied to your chest to help sound waves pass through your skin. A handheld device, called a transducer, will be pressed against your chest and moved over your heart. The transducer produces sound waves that travel to your heart and bounce back (or echo back) to the transducer. These sound waves will be captured in real-time and changed into images of your heart that can be viewed on a video monitor. The images will be recorded on a computer and reviewed by your health care provider. You may be asked to change positions or  hold your breath for a short time. This makes it easier to get different views or better views of your heart. In some cases, you may receive contrast through an IV in one of your veins. This can improve the quality of the pictures from your heart. The procedure may vary among health care providers and hospitals.   What can I expect after the test? You may return to your normal, everyday life, including diet, activities, and medicines, unless your health care provider tells you not to do that. Follow these instructions at home: It is up to you to get the results of your test. Ask your  health care provider, or the department that is doing the test, when your results will be ready. Keep all follow-up visits. This is important. Summary An echocardiogram is a test that uses sound waves (ultrasound) to produce images of the heart. Images from an echocardiogram can provide important information about the size and shape of your heart, heart muscle function, heart valve function, and other possible heart problems. You do not need to do anything to prepare before this test. You may eat and drink normally. After the echocardiogram is completed, you may return to your normal, everyday life, unless your health care provider tells you not to do that. This information is not intended to replace advice given to you by your health care provider. Make sure you discuss any questions you have with your health care provider. Document Revised: 10/15/2019 Document Reviewed: 10/15/2019 Elsevier Patient Education  2021 Elsevier Inc.   Important Information About Sugar

## 2024-01-04 NOTE — Progress Notes (Signed)
 Cardiology Consultation:    Date:  01/04/2024   ID:  Aaron Fowler, DOB 10/08/1970, MRN [id number]  PCP:  Aaron Aaron DASEN, PA-C  Cardiologist:  Aaron SAUNDERS Irva Loser, MD   Referring MD: Aaron Newborn, MD   No chief complaint on file.    ASSESSMENT AND PLAN:   [name] 53 year old male  history of psoriatic arthritis, anxiety, depression, GERD, postural lightheadedness, former smoker [quit 20 years ago around 2005], erectile dysfunction.  Recently diagnosed with pulmonary nodules and established care with pulmonologist. CT chest 11/25/2023 once again noted calcified splenic granulomas similar to previously noted imaging findings, lung nodules and nonobstructive right renal stones, and also identified advanced three-vessel coronary artery calcification and heart size was mention upper limits of normal to minimally enlarged and pulmonary trunk was reported as dilated.  Here for further evaluation of cardiac findings reported on CT chest and also reports symptoms of dyspnea on exertion and postural lightheadedness with syncope.  Problem List Items Addressed This Visit     Postural lightheadedness   Episodes of postural lightheadedness with syncope.  Reports 3-4 episodes of syncope over the past couple years.  He does have soft blood pressures to begin with at baseline and has been using Cialis  regularly almost on a daily basis.  Likely contributing to his symptoms.  Would recommend discontinuing Cialis . Recommended he stay well-hydrated. Advised precaution before changing position, such as to move his legs and calves before standing up.  His symptoms do not appear to be related to cardiac arrhythmias.      Coronary atherosclerosis - Primary   Severe coronary atherosclerosis noted on CT chest 12/03/2023.  Does have cardiovascular risk factors in the form of age, smoking history in the past, psoriasis.  Will proceed with cardiac CT coronary angiogram to rule out any  obstructive CAD.  Recommended to start taking aspirin 81 mg once daily.  Role of statins for risk reduction discussed. Would recommend starting rosuvastatin 10 mg 2 times a week [lower frequency in view of his chronic musculoskeletal pain issues].      Relevant Medications   ivabradine (CORLANOR) 7.5 MG TABS tablet   rosuvastatin (CRESTOR) 10 MG tablet   Other Relevant Orders   EKG 12-Lead (Completed)   Basic metabolic panel with GFR   CT CORONARY MORPH W/CTA COR W/SCORE W/CA W/CM &/OR WO/CM   ECHOCARDIOGRAM COMPLETE   Dyspnea on exertion   Likely multifactorial. Does have findings concerning for interstitial lung disease. With his coronary artery calcification observed on CT chest will rule out obstructive CAD with cardiac CT coronary angiogram.  Proceed with echocardiogram to assess cardiac structure and function.        Relevant Orders   ECHOCARDIOGRAM COMPLETE  Return to clinic in 3 months    History of Present Illness:    Aaron Fowler is a 53 y.o. male who is being seen today for the evaluation of coronary atherosclerosis noted on recent CT chest imaging, at the request of Baral, Dipti, MD, pulmonologist. PCP is Aaron Fowler.  PA-C.  Pleasant man here with a visit today by himself.  Has history of psoriatic arthritis, anxiety, depression, GERD, postural lightheadedness, former smoker [quit 20 years ago around 2005], erectile dysfunction.  Recently diagnosed with pulmonary nodules and established care with pulmonologist. CT chest 11/25/2023 once again noted calcified splenic granulomas similar to previously noted imaging findings, lung nodules and nonobstructive right renal stones, and also identified advanced three-vessel coronary artery calcification and heart size was mention upper  limits of normal to minimally enlarged and pulmonary trunk was reported as dilated.  Pleasant man.  Mentions has had longstanding history of shortness of breath with exertion which has  been gradually worsening. Also describes symptoms of postural lightheadedness associated with syncope on 3-4 different occasions over the past couple years.  Most of these were precipitated by standing up from sitting position and 1 occasion couple months ago after waking up and walking to use the bathroom, felt lightheaded as he bent forward and stood up and passed out briefly.  Patient denies any symptoms of chest pain or shortness of breath at rest. Describes sensation of shortness of breath with walking short distances.  No orthopnea or paroxysmal nocturnal dyspnea. No blood in urine or stools.  Reports chronic musculoskeletal aches and pains  EKG in the clinic today shows sinus rhythm heart rate 75/min, PR interval 126 ms, QRS duration 80 ms, QTc 433 ms.  No ischemic changes.  Lipid panel from 06/28/2023 shows total cholesterol 190, triglycerides 83, HDL 68, LDL 107. Hemoglobin A1c 5.3.   Past Medical History:  Diagnosis Date   Adjustment disorder with mixed anxiety and depressed mood 07/12/2022   Anemia 08/11/2023   Anxiety    Depression    Encounter for immunization 10/20/2023   GAD (generalized anxiety disorder) 06/28/2023   GERD (gastroesophageal reflux disease)    Insomnia    Long term current use of systemic steroids 12/24/2019   Lung nodule 11/08/2023   Muscle cramps 08/11/2023   Other male erectile dysfunction 10/20/2023   Other psoriasis 12/24/2019   Polyarthralgia 12/24/2019   Psoriasis    per patient    Psoriatic arthritis (HCC)    Rosacea 08/11/2023   Severe episode of recurrent major depressive disorder, without psychotic features (HCC) 06/28/2023   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   clonazePAM  (KLONOPIN ) 0.5 MG tablet Take 1 tablet (0.5 mg total) by mouth daily. (Patient taking differently: Take 0.5 mg by mouth as needed.)   ivabradine (CORLANOR) 7.5 MG TABS tablet Take 15 mg (2 tablets) two hours prior to your CT scan.   Multiple Vitamins-Minerals (MULTIVITAMIN ADULTS PO) Take by mouth daily.   omeprazole  (PRILOSEC) 20 MG capsule Take 1 capsule (20 mg total) by mouth daily.   predniSONE  (DELTASONE ) 5 MG tablet Take 5 mg by mouth daily.   rosuvastatin (CRESTOR) 10 MG tablet Take 1 tablet (10 mg total) by mouth 2 (two) times a week.   SKYRIZI PEN 150 MG/ML pen Inject 1 mL into the skin every 3 (three) months.   tadalafil  (CIALIS ) 10 MG tablet Take 1 tablet (10 mg total) by mouth every other day as needed for erectile dysfunction.     Allergies:   Sulfamethoxazole   Social History   Socioeconomic History   Marital status: Married    Spouse name: Leonce   Number of children: 4   Years of education: 12   Highest education level: GED or equivalent  Occupational History   Occupation:  [employer]    Comment: [employer]  Tobacco Use   Smoking status: Never    Passive exposure: Never   Smokeless tobacco: Never  Vaping Use   Vaping status: Every Day   Substances: Nicotine  Substance and Sexual Activity   Alcohol use: Yes    Comment: light social drinker, will drink to intoxication occasionally   Drug use: Not Currently    Types: Marijuana, Other-see comments    Comment: mushrooms   Sexual activity: Yes  Other Topics Concern   Not on file  Social History Narrative   2 adopted children    Social Drivers of Corporate Investment Banker Strain: Not on file  Food Insecurity: No Food Insecurity (06/28/2023)   Hunger Vital Sign    Worried About Running Out of Food in the Last Year: Never true    Ran Out of Food in the  Last Year: Never true  Transportation Needs: No Transportation Needs (06/28/2023)   PRAPARE - Administrator, Civil Service (Medical): No    Lack of Transportation (Non-Medical): No  Physical Activity: Not on file  Stress: Not on file  Social Connections: Not on file     Family History: The patient's family history includes Alcohol abuse in his father; Autoimmune disease in his sister; Dementia in his maternal grandmother; Heart Problems in his mother; Prostate cancer in his father. ROS:   Please see the history of present illness.    All 14 point review of systems negative except as described per history of present illness.  EKGs/Labs/Other Studies Reviewed:    The following studies were reviewed today:   EKG:  EKG Interpretation Date/Time:  Thursday January 04 2024 13:38:47 EDT Ventricular Rate:  75 PR Interval:  126 QRS Duration:  80 QT Interval:  388 QTC Calculation: 433 R Axis:   35  Text Interpretation: Normal sinus rhythm Normal ECG No previous ECGs available Confirmed by Liborio Hai reddy [phone]) on 01/04/2024 1:42:01 PM    Recent Labs: 06/28/2023: ALT 8; Hemoglobin 12.1; Platelets 208; TSH 2.280 07/07/2023: BUN 14; Creatinine, Ser 1.24; Potassium 4.9; Sodium 140 08/11/2023: Magnesium 2.1  Recent Lipid Panel    Component Value Date/Time   CHOL 190 06/28/2023 1523   TRIG 83 06/28/2023 1523   HDL 68 06/28/2023 1523   CHOLHDL 2.8 06/28/2023 1523   LDLCALC 107 (H) 06/28/2023 1523    Physical Exam:    VS:  BP 100/60   Pulse 75   Ht 6' (1.829 m)   Wt 207 lb (93.9 kg)   SpO2 99%   BMI 28.07 kg/m     Wt Readings from Last 3 Encounters:  01/04/24 207 lb (93.9 kg)  01/03/24 200 lb (90.7 kg)  12/04/23 202 lb 3.2 oz (91.7 kg)     GENERAL:  Well nourished, well developed in no acute distress NECK: No JVD; No carotid bruits CARDIAC: RRR, S1 and S2 present, no murmurs, no rubs, no gallops CHEST:  Clear to auscultation without rales, wheezing or  rhonchi  Extremities: No pitting pedal edema. Pulses bilaterally symmetric with radial 2+ and dorsalis pedis 2+ NEUROLOGIC:  Alert and oriented x 3  Medication Adjustments/Labs and Tests Ordered: Current medicines are reviewed at length with the patient today.  Concerns regarding medicines are outlined above.  Orders Placed This Encounter  Procedures   CT CORONARY MORPH W/CTA COR W/SCORE W/CA W/CM &/OR WO/CM   Basic metabolic panel with GFR   EKG 87-Ozji   ECHOCARDIOGRAM COMPLETE  Meds ordered this encounter  Medications   ivabradine (CORLANOR) 7.5 MG TABS tablet    Sig: Take 15 mg (2 tablets) two hours prior to your CT scan.    Dispense:  2 tablet    Refill:  0   rosuvastatin (CRESTOR) 10 MG tablet    Sig: Take 1 tablet (10 mg total) by mouth 2 (two) times a week.    Dispense:  32 tablet    Refill:  3    Signed, Kaushik Maul reddy Sofiah Lyne, MD, MPH, [facility]. 01/04/2024 2:17 PM    [facility]

## 2024-01-04 NOTE — Assessment & Plan Note (Signed)
 Likely multifactorial. Does have findings concerning for interstitial lung disease. With his coronary artery calcification observed on CT chest will rule out obstructive CAD with cardiac CT coronary angiogram.  Proceed with echocardiogram to assess cardiac structure and function.

## 2024-01-04 NOTE — Assessment & Plan Note (Signed)
 Severe coronary atherosclerosis noted on CT chest 12/03/2023.  Does have cardiovascular risk factors in the form of age, smoking history in the past, psoriasis.  Will proceed with cardiac CT coronary angiogram to rule out any obstructive CAD.  Recommended to start taking aspirin 81 mg once daily.  Role of statins for risk reduction discussed. Would recommend starting rosuvastatin 10 mg 2 times a week [lower frequency in view of his chronic musculoskeletal pain issues].

## 2024-01-05 LAB — BASIC METABOLIC PANEL WITH GFR
BUN/Creatinine Ratio: 15 (ref 9–20)
BUN: 15 mg/dL (ref 6–24)
CO2: 23 mmol/L (ref 20–29)
Calcium: 9.1 mg/dL (ref 8.7–10.2)
Chloride: 107 mmol/L — ABNORMAL HIGH (ref 96–106)
Creatinine, Ser: 1 mg/dL (ref 0.76–1.27)
Glucose: 116 mg/dL — ABNORMAL HIGH (ref 70–99)
Potassium: 4.2 mmol/L (ref 3.5–5.2)
Sodium: 144 mmol/L (ref 134–144)
eGFR: 91 mL/min/1.73 (ref >59)

## 2024-01-09 LAB — ANCA SCREEN W REFLEX TITER: ANCA SCREEN: NEGATIVE

## 2024-01-09 LAB — ANTI-SCLERODERMA ANTIBODY: Scleroderma (Scl-70) (ENA) Antibody, IgG: <1 AI

## 2024-01-09 LAB — ANA: Anti Nuclear Antibody (ANA): NEGATIVE

## 2024-01-09 LAB — RHEUMATOID FACTOR: Rheumatoid fact SerPl-aCnc: <10 [IU]/mL (ref <14)

## 2024-01-09 LAB — CYCLIC CITRUL PEPTIDE ANTIBODY, IGG: Cyclic Citrullin Peptide Ab: <16 U

## 2024-01-09 LAB — ANTI-DNA ANTIBODY, DOUBLE-STRANDED: ds DNA Ab: 3 [IU]/mL

## 2024-01-10 LAB — ANTI-JO-1 AB (RDL): Anti-Jo-1 Ab (RDL): <20 U (ref <20)

## 2024-01-25 DIAGNOSIS — M47816 Spondylosis without myelopathy or radiculopathy, lumbar region: Secondary | ICD-10-CM | POA: Diagnosis not present

## 2024-01-25 DIAGNOSIS — M5412 Radiculopathy, cervical region: Secondary | ICD-10-CM | POA: Diagnosis not present

## 2024-01-25 DIAGNOSIS — M47812 Spondylosis without myelopathy or radiculopathy, cervical region: Secondary | ICD-10-CM | POA: Diagnosis not present

## 2024-01-26 ENCOUNTER — Telehealth: Admitting: Behavioral Health

## 2024-01-26 ENCOUNTER — Encounter: Payer: Self-pay | Admitting: Behavioral Health

## 2024-01-26 ENCOUNTER — Telehealth (INDEPENDENT_AMBULATORY_CARE_PROVIDER_SITE_OTHER): Admitting: Behavioral Health

## 2024-01-26 DIAGNOSIS — F3130 Bipolar disorder, current episode depressed, mild or moderate severity, unspecified: Secondary | ICD-10-CM | POA: Diagnosis not present

## 2024-01-26 DIAGNOSIS — F411 Generalized anxiety disorder: Secondary | ICD-10-CM | POA: Diagnosis not present

## 2024-01-26 DIAGNOSIS — R454 Irritability and anger: Secondary | ICD-10-CM

## 2024-01-26 MED ORDER — CARIPRAZINE HCL 3 MG PO CAPS: 3.0000 mg | ORAL_CAPSULE | Freq: Every day | ORAL | 1 refills | Status: DC

## 2024-01-26 NOTE — Progress Notes (Signed)
 Aaron Fowler [id number] January 22, 1971 53 y.o.  Virtual Visit via Video Note  I connected with pt @ on 01/26/24 at  9:00 AM EST by a video enabled telemedicine application and verified that I am speaking with the correct person using two identifiers.   I discussed the limitations of evaluation and management by telemedicine and the availability of in person appointments. The patient expressed understanding and agreed to proceed.  I discussed the assessment and treatment plan with the patient. The patient was provided an opportunity to ask questions and all were answered. The patient agreed with the plan and demonstrated an understanding of the instructions.   The patient was advised to call back or seek an in-person evaluation if the symptoms worsen or if the condition fails to improve as anticipated.  I provided 20 minutes of non-face-to-face time during this encounter.  The patient was located at home.  The provider was located at [facility].   Aaron DELENA Pizza, NP   Subjective:   Patient ID:  Aaron Fowler is a 53 y.o. (DOB 28-Feb-1971) male.  Chief Complaint: No chief complaint on file.   HPI  Aaron Fowler, 53 year old male presents to this office for for follow up and medication management.  [name] appears a little bit more distressed and anxious today.  Says that he has noticed a little bit of an increase with anxiety and depression.  Also noticing some increased anger reaction again.  Says he has been having trouble with impulse control recently.  Experiencing at least 2 episodes per week of throwing objects or things when he gets angry.  Says that he probably needs a medication adjustment today. He rates his depression at 5/10 and his anxiety at 5/10.  He has been drinking wine at bedtime and understands he should cut back on his ETOh consumption. No current mania. He denies any psychosis or auditory or visual hallucinations.  He verbally contracted for safety with this clinical research associate.      Past psychiatric medication trials:   Trintellix  Ambien      Review of Systems:  Review of Systems  Constitutional: Negative.   Neurological: Negative.   Psychiatric/Behavioral:  Positive for dysphoric mood. The patient is nervous/anxious.     Medications: I have reviewed the patient's current medications.  Current Outpatient Medications  Medication Sig Dispense Refill   cariprazine  (VRAYLAR ) 3 MG capsule Take 1 capsule (3 mg total) by mouth daily. 30 capsule 1   cariprazine  (VRAYLAR ) 1.5 MG capsule Take 1 capsule (1.5 mg total) by mouth daily. 30 capsule 3   clonazePAM  (KLONOPIN ) 0.5 MG tablet Take 1 tablet (0.5 mg total) by mouth daily. (Patient taking differently: Take 0.5 mg by mouth as needed.) 30 tablet 3   ivabradine  (CORLANOR) 7.5 MG TABS tablet Take 15 mg (2 tablets) two hours prior to your CT scan. 2 tablet 0   Multiple Vitamins-Minerals (MULTIVITAMIN ADULTS PO) Take by mouth daily.     omeprazole  (PRILOSEC) 20 MG capsule Take 1 capsule (20 mg total) by mouth daily. 90 capsule 3   predniSONE  (DELTASONE ) 5 MG tablet Take 5 mg by mouth daily.     rosuvastatin  (CRESTOR ) 10 MG tablet Take 1 tablet (10 mg total) by mouth 2 (two) times a week. 32 tablet 3   tadalafil  (CIALIS ) 10 MG tablet Take 1 tablet (10 mg total) by mouth every other day as needed for erectile dysfunction. 15 tablet 2   Zoster Vaccine Adjuvanted (SHINGRIX ) injection Inject into the muscle. 1 each 0  No current facility-administered medications for this visit.    Medication Side Effects: None  Allergies:  Allergies  Allergen Reactions   Sulfamethoxazole Rash    Other reaction(s): Fever (intolerance)    Past Medical History:  Diagnosis Date   Adjustment disorder with mixed anxiety and depressed mood 07/12/2022   Anemia 08/11/2023   Anxiety    Depression    Encounter for immunization 10/20/2023   GAD (generalized anxiety disorder) 06/28/2023   GERD (gastroesophageal reflux disease)     Insomnia    Long term current use of systemic steroids 12/24/2019   Lung nodule 11/08/2023   Muscle cramps 08/11/2023   Other male erectile dysfunction 10/20/2023   Other psoriasis 12/24/2019   Polyarthralgia 12/24/2019   Psoriasis    per patient    Psoriatic arthritis (HCC)    Rosacea 08/11/2023   Severe episode of recurrent major depressive disorder, without psychotic features (HCC) 06/28/2023   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   Splenic lesion 11/08/2023   Urinary urgency 10/20/2023    Family History  Problem Relation Age of Onset   Heart Problems Mother    Alcohol abuse Father    Prostate cancer Father        anget orange   Autoimmune disease Sister    Dementia Maternal Grandmother     Social History   Socioeconomic History   Marital status: Married    Spouse name: Leonce   Number of children: 4   Years of education: 12   Highest education level: GED or equivalent  Occupational History   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    Comment: [employer]  Tobacco Use   Smoking status: Never    Passive exposure: Never   Smokeless tobacco: Never  Vaping Use   Vaping status: Every Day   Substances: Nicotine  Substance and Sexual Activity   Alcohol use: Yes    Comment: light social drinker, will drink to intoxication occasionally   Drug use: Not Currently    Types: Marijuana, Other-see comments    Comment: mushrooms   Sexual activity: Yes  Other Topics Concern   Not on file  Social History Narrative   2 adopted children    Social Drivers of Health   Financial Resource Strain: Not on file  Food Insecurity: No Food Insecurity (06/28/2023)   Hunger Vital Sign    Worried About Running Out of Food in the Last Year: Never true    Ran Out of Food in the Last Year: Never true  Transportation Needs: No Transportation Needs (06/28/2023)   PRAPARE - Transportation    Lack of Transportation (Medical): No    Lack of Transportation (Non-Medical): No  Physical Activity: Not on file   Stress: Not on file  Social Connections: Not on file  Intimate Partner Violence: Not At Risk (06/28/2023)   Humiliation, Afraid, Rape, and Kick questionnaire    Fear of Current or Ex-Partner: No    Emotionally Abused: No    Physically Abused: No    Sexually Abused: No    Past Medical History, Surgical history, Social history, and Family history were reviewed and updated as appropriate.   Please see review of systems for further details on the patient's review from today.   Objective:   Physical Exam:  There were no vitals taken for this visit.  Physical Exam Constitutional:      General: He is not in acute distress.    Appearance: Normal appearance.  Neurological:     Mental Status: He is alert and  oriented to person, place, and time.     Gait: Gait normal.  Psychiatric:        Attention and Perception: Attention and perception normal. He does not perceive auditory or visual hallucinations.        Mood and Affect: Mood and affect normal. Mood is not anxious or depressed. Affect is not labile.        Speech: Speech normal.        Behavior: Behavior normal. Behavior is cooperative.        Thought Content: Thought content normal.        Cognition and Memory: Cognition and memory normal.        Judgment: Judgment normal.     Lab Review:     Component Value Date/Time   NA 144 01/04/2024 1433   K 4.2 01/04/2024 1433   CL 107 (H) 01/04/2024 1433   CO2 23 01/04/2024 1433   GLUCOSE 116 (H) 01/04/2024 1433   BUN 15 01/04/2024 1433   CREATININE 1.00 01/04/2024 1433   CALCIUM  9.1 01/04/2024 1433   PROT 6.7 06/28/2023 1523   ALBUMIN 4.3 06/28/2023 1523   AST 32 06/28/2023 1523   ALT 8 06/28/2023 1523   ALKPHOS 70 06/28/2023 1523   BILITOT 0.3 06/28/2023 1523       Component Value Date/Time   WBC 5.8 06/28/2023 1523   WBC 3.0 (L) 07/18/2005 0835   RBC 4.10 (L) 06/28/2023 1523   RBC 4.31 07/18/2005 0835   HGB 12.1 (L) 06/28/2023 1523   HGB 12.2 (L) 07/18/2005 0835    HCT 37.0 (L) 06/28/2023 1523   HCT 36.3 (L) 07/18/2005 0835   PLT 208 06/28/2023 1523   MCV 90 06/28/2023 1523   MCV 84.1 07/18/2005 0835   MCH 29.5 06/28/2023 1523   MCH 28.3 07/18/2005 0835   MCHC 32.7 06/28/2023 1523   MCHC 33.7 07/18/2005 0835   RDW 12.9 06/28/2023 1523   RDW 15.2 (H) 07/18/2005 0835   LYMPHSABS 1.1 06/28/2023 1523   LYMPHSABS 0.7 (L) 07/18/2005 0835   MONOABS 0.2 07/18/2005 0835   EOSABS 0.0 06/28/2023 1523   BASOSABS 0.0 06/28/2023 1523   BASOSABS 0.0 07/18/2005 0835    No results found for: POCLITH, LITHIUM   No results found for: PHENYTOIN, PHENOBARB, VALPROATE, CBMZ   .res Assessment: Plan:    Greater than 50% of 30  min face to face time with patient was spent on counseling and coordination of care.  We discussed his recent decline with anxiety, depression, and problems with anger and impulse control.  Previously reporting Vraylar  working very well the last several visits.  He cannot identify any known triggers.  I counseled him on using caution about ETOH use and how it could interfere or interact with his psychiatric medications.  Reinforced good sleep hygiene.   We agreed today to:   To increase Vraylar  3 mg daily after breakfast.  Patient was instructed that if he has any problems obtaining the medication to come by the office and we will provide samples until we can solve any issues.  Explained that he should not go cold turkey on the medication. To hold Seroquel  to 100 mg at bedtime for sleep and anxiety.  To follow up in 6 weeks to reassess Will report worsening symptoms or side effects immediately Provided emergency contact information to include 911, nearest ER, [facility], or after-hours emergency line. Monitor for any sign of rash. Please taking Lamictal  and contact office immediately rash develops. Recommend seeking  urgent medical attention if rash is severe and/or spreading quickly.  Discussed potential metabolic side effects associated  with atypical antipsychotics, as well as potential risk for movement side effects. Advised pt to contact office if movement side effects occur.   Reviewed PDMP      Aaron LABOR. Inesha Sow, NP      Diagnoses and all orders for this visit:  Bipolar I disorder, most recent episode depressed (HCC) -     cariprazine  (VRAYLAR ) 3 MG capsule; Take 1 capsule (3 mg total) by mouth daily.  Generalized anxiety disorder -     cariprazine  (VRAYLAR ) 3 MG capsule; Take 1 capsule (3 mg total) by mouth daily.  Anger     Please see After Visit Summary for patient specific instructions.  Future Appointments  Date Time Provider Department Center  02/08/2024  1:00 PM [facility] [place]  1 [facility] None  02/15/2024 12:00 PM [facility] 1 [facility] [place]  03/08/2024 11:00 AM Teresa Aaron LABOR, NP CP-[facility] None  04/05/2024  1:00 PM Madireddy, Alean SAUNDERS, MD [facility] None  05/24/2024 10:00 AM [facility] [address]  05/24/2024 11:00 AM Pleas, Dipti, MD [facility] [address]  05/31/2024 11:10 AM Rothfuss, Lang DASEN, PA-C [facility] [address]    No orders of the defined types were placed in this encounter.     -------------------------------

## 2024-02-08 ENCOUNTER — Ambulatory Visit

## 2024-02-08 DIAGNOSIS — R0609 Other forms of dyspnea: Secondary | ICD-10-CM

## 2024-02-08 DIAGNOSIS — I2510 Atherosclerotic heart disease of native coronary artery without angina pectoris: Secondary | ICD-10-CM | POA: Diagnosis not present

## 2024-02-08 LAB — ECHOCARDIOGRAM COMPLETE
AR max vel: 2.33 cm2
AV Area VTI: 2.33 cm2
AV Area mean vel: 2.25 cm2
AV Mean grad: 3 mmHg
AV Peak grad: 5.4 mmHg
Ao pk vel: 1.16 m/s
Area-P 1/2: 2.12 cm2
MV VTI: 1.36 cm2
S' Lateral: 3.3 cm

## 2024-02-11 ENCOUNTER — Encounter (HOSPITAL_BASED_OUTPATIENT_CLINIC_OR_DEPARTMENT_OTHER): Payer: Self-pay | Admitting: Radiology

## 2024-02-13 ENCOUNTER — Encounter (HOSPITAL_COMMUNITY): Payer: Self-pay

## 2024-02-15 ENCOUNTER — Ambulatory Visit (INDEPENDENT_AMBULATORY_CARE_PROVIDER_SITE_OTHER)
Admission: RE | Admit: 2024-02-15 | Discharge: 2024-02-15 | Disposition: A | Discharge status: Home / Self Care | Source: Ambulatory Visit

## 2024-02-15 DIAGNOSIS — I2510 Atherosclerotic heart disease of native coronary artery without angina pectoris: Secondary | ICD-10-CM | POA: Diagnosis not present

## 2024-02-15 MED ORDER — IOHEXOL 350 MG/ML SOLN
95.0000 mL | Freq: Once | INTRAVENOUS | Status: AC | PRN
  Administered 2024-02-15: 95 mL via INTRAVENOUS

## 2024-02-15 MED ORDER — NITROGLYCERIN 0.4 MG SL SUBL
0.8000 mg | SUBLINGUAL_TABLET | Freq: Once | SUBLINGUAL | Status: AC
  Administered 2024-02-15: 0.8 mg via SUBLINGUAL

## 2024-03-08 ENCOUNTER — Encounter: Payer: Self-pay | Admitting: Behavioral Health

## 2024-03-08 ENCOUNTER — Ambulatory Visit (HOSPITAL_BASED_OUTPATIENT_CLINIC_OR_DEPARTMENT_OTHER)
Admission: EM | Admit: 2024-03-08 | Discharge: 2024-03-08 | Disposition: A | Discharge status: Home / Self Care | Attending: Family Medicine | Admitting: Family Medicine

## 2024-03-08 ENCOUNTER — Encounter (HOSPITAL_BASED_OUTPATIENT_CLINIC_OR_DEPARTMENT_OTHER): Payer: Self-pay

## 2024-03-08 ENCOUNTER — Telehealth: Admitting: Behavioral Health

## 2024-03-08 DIAGNOSIS — F411 Generalized anxiety disorder: Secondary | ICD-10-CM | POA: Diagnosis not present

## 2024-03-08 DIAGNOSIS — G43809 Other migraine, not intractable, without status migrainosus: Secondary | ICD-10-CM | POA: Diagnosis not present

## 2024-03-08 DIAGNOSIS — F3130 Bipolar disorder, current episode depressed, mild or moderate severity, unspecified: Secondary | ICD-10-CM | POA: Diagnosis not present

## 2024-03-08 MED ORDER — AMOXICILLIN-POT CLAVULANATE 875-125 MG PO TABS: 1.0000 | ORAL_TABLET | Freq: Two times a day (BID) | ORAL | 0 refills | Status: AC

## 2024-03-08 MED ORDER — METHYLPREDNISOLONE SODIUM SUCC 125 MG IJ SOLR: 80.0000 mg | Freq: Once | INTRAMUSCULAR | Status: DC

## 2024-03-08 MED ORDER — METHYLPREDNISOLONE ACETATE 80 MG/ML IJ SUSP
80.0000 mg | Freq: Once | INTRAMUSCULAR | Status: AC
  Administered 2024-03-08: 80 mg via INTRAMUSCULAR

## 2024-03-08 MED ORDER — DEXAMETHASONE SOD PHOSPHATE PF 10 MG/ML IJ SOLN: 10.0000 mg | Freq: Once | INTRAMUSCULAR | Status: DC

## 2024-03-08 MED ORDER — ONDANSETRON HCL 4 MG/2ML IJ SOLN
4.0000 mg | Freq: Once | INTRAMUSCULAR | Status: AC
  Administered 2024-03-08: 4 mg via INTRAMUSCULAR

## 2024-03-08 MED ORDER — CARIPRAZINE HCL 3 MG PO CAPS: 3.0000 mg | ORAL_CAPSULE | Freq: Every day | ORAL | 3 refills | Status: AC

## 2024-03-08 MED ORDER — KETOROLAC TROMETHAMINE 30 MG/ML IJ SOLN
30.0000 mg | Freq: Once | INTRAMUSCULAR | Status: AC
  Administered 2024-03-08: 30 mg via INTRAMUSCULAR

## 2024-03-08 NOTE — ED Provider Notes (Signed)
 " Aaron Fowler    CSN: [id number] Arrival date & time: 03/08/24  1654      History   Chief Complaint Chief Complaint  Patient presents with   Migraine    HPI Aaron Fowler is a 54 y.o. male.   Patient is a 54 year old male who presents today with migraine, facial swelling.  States awakened in the middle of the night with a severe headache. Patient has a hx of migraines. Has been seen in urgent Fowler in July for same and received IM Zofran  and Toradol . This did help. Some nasal congestion and eye drainage, ear pain. Pain in the back of head with photophobia and nausea, vomiting.     Migraine    Past Medical History:  Diagnosis Date   Adjustment disorder with mixed anxiety and depressed mood 07/12/2022   Anemia 08/11/2023   Anxiety    Depression    Encounter for immunization 10/20/2023   GAD (generalized anxiety disorder) 06/28/2023   GERD (gastroesophageal reflux disease)    Insomnia    Long term current use of systemic steroids 12/24/2019   Lung nodule 11/08/2023   Muscle cramps 08/11/2023   Other male erectile dysfunction 10/20/2023   Other psoriasis 12/24/2019   Polyarthralgia 12/24/2019   Psoriasis    per patient    Psoriatic arthritis (HCC)    Rosacea 08/11/2023   Severe episode of recurrent major depressive disorder, without psychotic features (HCC) 06/28/2023   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   Splenic lesion 11/08/2023   Urinary urgency 10/20/2023    Patient Active Problem List   Diagnosis Date Noted   Postural lightheadedness 01/04/2024   Coronary atherosclerosis 01/04/2024   Dyspnea on exertion 01/04/2024   Anxiety    Depression    GERD (gastroesophageal reflux disease)    Insomnia    Psoriasis    Psoriatic arthritis (HCC)    Splenic lesion 11/08/2023   Lung nodule 11/08/2023   Encounter for immunization 10/20/2023   Other male erectile dysfunction 10/20/2023   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   Urinary urgency 10/20/2023   Muscle cramps  08/11/2023   Anemia 08/11/2023   Rosacea 08/11/2023   GAD (generalized anxiety disorder) 06/28/2023   Severe episode of recurrent major depressive disorder, without psychotic features (HCC) 06/28/2023   Adjustment disorder with mixed anxiety and depressed mood 07/12/2022   Other psoriasis 12/24/2019   Long term current use of systemic steroids 12/24/2019   Polyarthralgia 12/24/2019    Past Surgical History:  Procedure Laterality Date   ANAL FISSURE REPAIR     BONE MARROW BIOPSY     CATARACT EXTRACTION, BILATERAL     10/28/2019, 12/16/2019    CYST EXCISION     on buttock    LYMPH NODE BIOPSY         Home Medications    Prior to Admission medications  Medication Sig Start Date End Date Taking? Authorizing Provider  cariprazine  (VRAYLAR ) 3 MG capsule Take 1 capsule (3 mg total) by mouth daily. 03/08/24   Teresa Redell LABOR, NP  clonazePAM  (KLONOPIN ) 0.5 MG tablet Take 1 tablet (0.5 mg total) by mouth daily. Patient taking differently: Take 0.5 mg by mouth as needed. 11/03/23   Teresa Redell LABOR, NP  ivabradine  (CORLANOR) 7.5 MG TABS tablet Take 15 mg (2 tablets) two hours prior to your CT scan. 01/04/24   Madireddy, Alean SAUNDERS, MD  Multiple Vitamins-Minerals (MULTIVITAMIN ADULTS PO) Take by mouth daily.    [provider]  omeprazole  (PRILOSEC) 20 MG capsule Take  1 capsule (20 mg total) by mouth daily. 11/30/23   Rothfuss, Jacob T, PA-C  predniSONE  (DELTASONE ) 5 MG tablet Take 5 mg by mouth daily.    [provider]  rosuvastatin  (CRESTOR ) 10 MG tablet Take 1 tablet (10 mg total) by mouth 2 (two) times a week. 01/04/24 12/29/24  Madireddy, Alean SAUNDERS, MD  tadalafil  (CIALIS ) 10 MG tablet Take 1 tablet (10 mg total) by mouth every other day as needed for erectile dysfunction. 10/17/23   Rothfuss, Jacob T, PA-C  Zoster Vaccine Adjuvanted (SHINGRIX ) injection Inject into the muscle. 01/04/24   Luiz Channel, MD    Family History Family History  Problem Relation Age of Onset    Heart Problems Mother    Alcohol abuse Father    Prostate cancer Father        anget orange   Autoimmune disease Sister    Dementia Maternal Grandmother     Social History Social History[1]   Allergies   Sulfamethoxazole   Review of Systems Review of Systems See HPI  Physical Exam Triage Vital Signs ED Triage Vitals  Encounter Vitals Group     BP 03/08/24 1856 114/81     Girls Systolic BP Percentile --      Girls Diastolic BP Percentile --      Boys Systolic BP Percentile --      Boys Diastolic BP Percentile --      Pulse Rate 03/08/24 1856 69     Resp 03/08/24 1856 20     Temp 03/08/24 1856 99.6 F (37.6 C)     Temp Source 03/08/24 1856 Oral     SpO2 03/08/24 1856 97 %     Weight --      Height --      Head Circumference --      Peak Flow --      Pain Score 03/08/24 1859 8     Pain Loc --      Pain Education --      Exclude from Growth Chart --    No data found.  Updated Vital Signs BP 114/81 (BP Location: Right Arm)   Pulse 69   Temp 99.6 F (37.6 C) (Oral)   Resp 20   SpO2 97%   Visual Acuity Right Eye Distance:   Left Eye Distance:   Bilateral Distance:    Right Eye Near:   Left Eye Near:    Bilateral Near:     Physical Exam Constitutional:      General: He is not in acute distress.    Appearance: Normal appearance. He is ill-appearing. He is not toxic-appearing or diaphoretic.  HENT:     Right Ear: Tympanic membrane, ear canal and external ear normal.     Left Ear: Tympanic membrane, ear canal and external ear normal.     Nose: Congestion present.  Eyes:     General:        Left eye: Discharge present.    Comments: Left eye redness and tearing. Some swelling around the eye.   Cardiovascular:     Rate and Rhythm: Normal rate and regular rhythm.  Pulmonary:     Effort: Pulmonary effort is normal.     Breath sounds: Normal breath sounds.  Musculoskeletal:        General: Normal range of motion.  Neurological:     General: No  focal deficit present.     Mental Status: He is alert.  Psychiatric:  Mood and Affect: Mood normal.      UC Treatments / Results  Labs (all labs ordered are listed, but only abnormal results are displayed) Labs Reviewed - No data to display  EKG   Radiology No results found.  Procedures Procedures (including critical Fowler time)  Medications Ordered in UC Medications - No data to display  Initial Impression / Assessment and Plan / UC Course  I have reviewed the triage vital signs and the nursing notes.  Pertinent labs & imaging results that were available during my Fowler of the patient were reviewed by me and considered in my medical decision making (see chart for details).     *** Final Clinical Impressions(s) / UC Diagnoses   Final diagnoses:  None   Discharge Instructions   None    ED Prescriptions   None    PDMP not reviewed this encounter.     [1]  Social History Tobacco Use   Smoking status: Never    Passive exposure: Never   Smokeless tobacco: Never  Vaping Use   Vaping status: Every Day   Substances: Nicotine  Substance Use Topics   Alcohol use: Yes    Comment: light social drinker, will drink to intoxication occasionally   Drug use: Not Currently    Types: Marijuana, Other-see comments    Comment: mushrooms   "

## 2024-03-08 NOTE — Progress Notes (Signed)
 Aaron Fowler [id number] 28-Feb-1971 54 y.o.  Virtual Visit via Video Note  I connected with pt @ on 03/08/2024 at 11:00 AM EST by a video enabled telemedicine application and verified that I am speaking with the correct person using two identifiers.   I discussed the limitations of evaluation and management by telemedicine and the availability of in person appointments. The patient expressed understanding and agreed to proceed.  I discussed the assessment and treatment plan with the patient. The patient was provided an opportunity to ask questions and all were answered. The patient agreed with the plan and demonstrated an understanding of the instructions.   The patient was advised to call back or seek an in-person evaluation if the symptoms worsen or if the condition fails to improve as anticipated.  I provided 30 minutes of non-face-to-face time during this encounter.  The patient was located at home.  The provider was located at [facility].   Aaron DELENA Pizza, NP   Subjective:   Patient ID:  Aaron Fowler is a 54 y.o. (DOB 1970/12/17) male.  Chief Complaint:  Chief Complaint  Patient presents with   Depression   Anxiety   Follow-up   Medication Refill   Patient Education    HPI Aaron Fowler, 54 year old male presents via video visit to this office for for follow up and medication management.  Reports having a bad migraine today and not feeling well.  Overall says that Vraylar  continues to help with anxiety and depression.  Has benefited from the increase of Vraylar  to 3 mg.  Requesting no medication adjustments or changes today.  He rates his depression at 3/10 and his anxiety at 3/10. No current mania. He denies any psychosis or auditory or visual hallucinations.  He verbally contracted for safety with this clinical research associate.     Past psychiatric medication trials:   Trintellix  Ambien    Review of Systems:  Review of Systems  Constitutional: Negative.   Allergic/Immunologic:  Negative.   Neurological:  Positive for headaches.  Psychiatric/Behavioral: Negative.      Medications: I have reviewed the patient's current medications.  Current Outpatient Medications  Medication Sig Dispense Refill   cariprazine  (VRAYLAR ) 3 MG capsule Take 1 capsule (3 mg total) by mouth daily. 30 capsule 3   clonazePAM  (KLONOPIN ) 0.5 MG tablet Take 1 tablet (0.5 mg total) by mouth daily. (Patient taking differently: Take 0.5 mg by mouth as needed.) 30 tablet 3   ivabradine  (CORLANOR) 7.5 MG TABS tablet Take 15 mg (2 tablets) two hours prior to your CT scan. 2 tablet 0   Multiple Vitamins-Minerals (MULTIVITAMIN ADULTS PO) Take by mouth daily.     omeprazole  (PRILOSEC) 20 MG capsule Take 1 capsule (20 mg total) by mouth daily. 90 capsule 3   predniSONE  (DELTASONE ) 5 MG tablet Take 5 mg by mouth daily.     rosuvastatin  (CRESTOR ) 10 MG tablet Take 1 tablet (10 mg total) by mouth 2 (two) times a week. 32 tablet 3   tadalafil  (CIALIS ) 10 MG tablet Take 1 tablet (10 mg total) by mouth every other day as needed for erectile dysfunction. 15 tablet 2   Zoster Vaccine Adjuvanted (SHINGRIX ) injection Inject into the muscle. 1 each 0   No current facility-administered medications for this visit.    Medication Side Effects: None  Allergies: Allergies[1]  Past Medical History:  Diagnosis Date   Adjustment disorder with mixed anxiety and depressed mood 07/12/2022   Anemia 08/11/2023   Anxiety    Depression  Encounter for immunization 10/20/2023   GAD (generalized anxiety disorder) 06/28/2023   GERD (gastroesophageal reflux disease)    Insomnia    Long term current use of systemic steroids 12/24/2019   Lung nodule 11/08/2023   Muscle cramps 08/11/2023   Other male erectile dysfunction 10/20/2023   Other psoriasis 12/24/2019   Polyarthralgia 12/24/2019   Psoriasis    per patient    Psoriatic arthritis (HCC)    Rosacea 08/11/2023   Severe episode of recurrent major depressive  disorder, without psychotic features (HCC) 06/28/2023   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   Splenic lesion 11/08/2023   Urinary urgency 10/20/2023    Family History  Problem Relation Age of Onset   Heart Problems Mother    Alcohol abuse Father    Prostate cancer Father        anget orange   Autoimmune disease Sister    Dementia Maternal Grandmother     Social History   Socioeconomic History   Marital status: Married    Spouse name: Aaron Fowler   Number of children: 4   Years of education: 12   Highest education level: GED or equivalent  Occupational History   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    Comment: [employer]  Tobacco Use   Smoking status: Never    Passive exposure: Never   Smokeless tobacco: Never  Vaping Use   Vaping status: Every Day   Substances: Nicotine  Substance and Sexual Activity   Alcohol use: Yes    Comment: light social drinker, will drink to intoxication occasionally   Drug use: Not Currently    Types: Marijuana, Other-see comments    Comment: mushrooms   Sexual activity: Yes  Other Topics Concern   Not on file  Social History Narrative   2 adopted children    Social Drivers of Health   Tobacco Use: Low Risk (01/26/2024)   Patient History    Smoking Tobacco Use: Never    Smokeless Tobacco Use: Never    Passive Exposure: Never  Financial Resource Strain: Not on file  Food Insecurity: No Food Insecurity (06/28/2023)   Hunger Vital Sign    Worried About Running Out of Food in the Last Year: Never true    Ran Out of Food in the Last Year: Never true  Transportation Needs: No Transportation Needs (06/28/2023)   PRAPARE - Administrator, Civil Service (Medical): No    Lack of Transportation (Non-Medical): No  Physical Activity: Not on file  Stress: Not on file  Social Connections: Not on file  Intimate Partner Violence: Not At Risk (06/28/2023)   Humiliation, Afraid, Rape, and Kick questionnaire    Fear of Current or Ex-Partner: No    Emotionally  Abused: No    Physically Abused: No    Sexually Abused: No  Depression (PHQ2-9): Medium Risk (10/17/2023)   Depression (PHQ2-9)    PHQ-2 Score: 5  Alcohol Screen: Not on file  Housing: Unknown (08/11/2023)   Housing Stability Vital Sign    Unable to Pay for Housing in the Last Year: No    Number of Times Moved in the Last Year: Not on file    Homeless in the Last Year: No  Utilities: Not At Risk (06/28/2023)   AHC Utilities    Threatened with loss of utilities: No  Health Literacy: Not on file    Past Medical History, Surgical history, Social history, and Family history were reviewed and updated as appropriate.   Please see review of systems for further  details on the patient's review from today.   Objective:   Physical Exam:  There were no vitals taken for this visit.  Physical Exam Constitutional:      General: He is not in acute distress.    Appearance: Normal appearance.  Neurological:     Mental Status: He is alert and oriented to person, place, and time.     Gait: Gait normal.  Psychiatric:        Attention and Perception: Attention and perception normal. He does not perceive auditory or visual hallucinations.        Mood and Affect: Mood and affect normal. Mood is not anxious or depressed. Affect is not labile.        Speech: Speech normal.        Behavior: Behavior normal. Behavior is cooperative.        Thought Content: Thought content normal.        Cognition and Memory: Cognition and memory normal.        Judgment: Judgment normal.     Lab Review:     Component Value Date/Time   NA 144 01/04/2024 1433   K 4.2 01/04/2024 1433   CL 107 (H) 01/04/2024 1433   CO2 23 01/04/2024 1433   GLUCOSE 116 (H) 01/04/2024 1433   BUN 15 01/04/2024 1433   CREATININE 1.00 01/04/2024 1433   CALCIUM  9.1 01/04/2024 1433   PROT 6.7 06/28/2023 1523   ALBUMIN 4.3 06/28/2023 1523   AST 32 06/28/2023 1523   ALT 8 06/28/2023 1523   ALKPHOS 70 06/28/2023 1523   BILITOT 0.3  06/28/2023 1523       Component Value Date/Time   WBC 5.8 06/28/2023 1523   WBC 3.0 (L) 07/18/2005 0835   RBC 4.10 (L) 06/28/2023 1523   RBC 4.31 07/18/2005 0835   HGB 12.1 (L) 06/28/2023 1523   HGB 12.2 (L) 07/18/2005 0835   HCT 37.0 (L) 06/28/2023 1523   HCT 36.3 (L) 07/18/2005 0835   PLT 208 06/28/2023 1523   MCV 90 06/28/2023 1523   MCV 84.1 07/18/2005 0835   MCH 29.5 06/28/2023 1523   MCH 28.3 07/18/2005 0835   MCHC 32.7 06/28/2023 1523   MCHC 33.7 07/18/2005 0835   RDW 12.9 06/28/2023 1523   RDW 15.2 (H) 07/18/2005 0835   LYMPHSABS 1.1 06/28/2023 1523   LYMPHSABS 0.7 (L) 07/18/2005 0835   MONOABS 0.2 07/18/2005 0835   EOSABS 0.0 06/28/2023 1523   BASOSABS 0.0 06/28/2023 1523   BASOSABS 0.0 07/18/2005 0835    No results found for: POCLITH, LITHIUM   No results found for: PHENYTOIN, PHENOBARB, VALPROATE, CBMZ   .res Assessment: Plan:   Greater than 50% of 30  min video visit time with patient was spent on counseling and coordination of care.  Discussed his overall improvement since increasing Vraylar  to 3 mg.  Agrees to take medications as prescribed.  Reinforced  using caution about ETOH use and how it could interfere or interact with his psychiatric medications.  Reinforced good sleep hygiene.   We agreed today to:   Continue Vraylar  3 mg daily after breakfast.  Patient was instructed that if he has any problems obtaining the medication to come by the office and we will provide samples until we can solve any issues.  Explained that he should not go cold turkey on the medication. To hold Seroquel  to 100 mg at bedtime for sleep and anxiety.  To follow up in 3 months to reassess Will report worsening  symptoms or side effects immediately Provided emergency contact information to include 911, nearest ER, [facility], or after-hours emergency line. Monitor for any sign of rash. Please taking Lamictal  and contact office immediately rash develops. Recommend seeking  urgent medical attention if rash is severe and/or spreading quickly.  Discussed potential metabolic side effects associated with atypical antipsychotics, as well as potential risk for movement side effects. Advised pt to contact office if movement side effects occur.   Reviewed PDMP    Aaron LABOR. Salar Molden, NP     Estel was seen today for depression, anxiety, follow-up, medication refill and patient education.  Diagnoses and all orders for this visit:  Bipolar I disorder, most recent episode depressed (HCC) -     cariprazine  (VRAYLAR ) 3 MG capsule; Take 1 capsule (3 mg total) by mouth daily.  Generalized anxiety disorder -     cariprazine  (VRAYLAR ) 3 MG capsule; Take 1 capsule (3 mg total) by mouth daily.     Please see After Visit Summary for patient specific instructions.  Future Appointments  Date Time Provider Department Center  04/05/2024  1:00 PM Madireddy, Alean SAUNDERS, MD [facility] None  05/24/2024 10:00 AM [facility] [address]  05/24/2024 11:00 AM Pleas, Dipti, MD [facility] [address]  05/31/2024 11:10 AM Rothfuss, Lang DASEN, PA-C [facility] [address]    No orders of the defined types were placed in this encounter.     -------------------------------     [1]  Allergies Allergen Reactions   Sulfamethoxazole Rash    Other reaction(s): Fever (intolerance)

## 2024-03-08 NOTE — ED Triage Notes (Signed)
 States awakened in the middle of the night with a severe headache. Patient has a hx of migraines. Has been seen in urgent care in July for same and received IM Zofran  and Toradol .

## 2024-03-08 NOTE — ED Notes (Signed)
 Patient reports headache starting to improve. Pain now 5. Vitals reassessed. Provider notified.

## 2024-03-08 NOTE — Discharge Instructions (Addendum)
 We treated you for migraine here today.  Gave you a steroid injection, Toradol  and Zofran  for nausea, vomiting. Tried to go home and rest and sip electrolyte fluids to rehydrate. Sending in antibiotics to treat a potential sinus infection.  If your symptoms worsen despite this treatment we have given you here today you will have to go to the ER.

## 2024-04-05 ENCOUNTER — Ambulatory Visit

## 2024-04-05 VITALS — BP 116/84 | HR 78 | Ht 72.0 in | Wt 210.1 lb

## 2024-04-05 DIAGNOSIS — R42 Dizziness and giddiness: Secondary | ICD-10-CM | POA: Diagnosis not present

## 2024-04-05 DIAGNOSIS — I2510 Atherosclerotic heart disease of native coronary artery without angina pectoris: Secondary | ICD-10-CM | POA: Diagnosis not present

## 2024-04-05 DIAGNOSIS — E782 Mixed hyperlipidemia: Secondary | ICD-10-CM | POA: Diagnosis not present

## 2024-04-05 DIAGNOSIS — E785 Hyperlipidemia, unspecified: Secondary | ICD-10-CM | POA: Insufficient documentation

## 2024-04-05 MED ORDER — ROSUVASTATIN CALCIUM 10 MG PO TABS: 10.0000 mg | ORAL_TABLET | Freq: Every day | ORAL | 3 refills | Status: AC

## 2024-04-05 MED ORDER — ASPIRIN 81 MG PO TBEC: 81.0000 mg | DELAYED_RELEASE_TABLET | Freq: Every day | ORAL | Status: AC

## 2024-04-05 NOTE — Patient Instructions (Signed)
 Medication Instructions:  Your physician has recommended you make the following change in your medication:   Start taking Aspirin  81 mg daily & Rosuvastatin  10 mg daily   *If you need a refill on your cardiac medications before your next appointment, please call your pharmacy*   Lab Work: Your physician recommends that you return for lab work in: 6 months You need to have labs done when you are fasting.  You can come Monday through Friday 8:30 am to 12:00 pm and 1:15 to 4:30. You do not need to make an appointment as the order has already been placed.   If you have labs (blood work) drawn today and your tests are completely normal, you will receive your results only by: MyChart Message (if you have MyChart) OR A paper copy in the mail If you have any lab test that is abnormal or we need to change your treatment, we will call you to review the results.   Testing/Procedures: None ordered   Follow-Up: At [facility], you and your health needs are our priority.  As part of our continuing mission to provide you with exceptional heart care, we have created designated Provider Care Teams.  These Care Teams include your primary Cardiologist (physician) and Advanced Practice Providers (APPs -  Physician Assistants and Nurse Practitioners) who all work together to provide you with the care you need, when you need it.  We recommend signing up for the patient portal called MyChart.  Sign up information is provided on this After Visit Summary.  MyChart is used to connect with patients for Virtual Visits (Telemedicine).  Patients are able to view lab/test results, encounter notes, upcoming appointments, etc.  Non-urgent messages can be sent to your provider as well.   To learn more about what you can do with MyChart, go to [email or web address].    Your next appointment:   7 month(s)  The format for your next appointment:   In Person  Provider:   Alean Kobus, MD    Other  Instructions none  Important Information About Sugar

## 2024-04-05 NOTE — Assessment & Plan Note (Signed)
 Coronary atherosclerosis noted initially on CT chest. Cardiac CT completed 02/15/2024 noted coronary calcium  score 229, CAD RADS 2 study with mild nonobstructive disease.  No significant extracardiac findings.  Aorta and pulmonary artery were noted to be normal in size.  Asymptomatic good functional status. However no regular exercise. Discussed the findings. Recommended lipid-lowering therapy with statins as reviewed under hyperlipidemia.  For further risk reduction aspirin  81 mg once daily recommended, discussed potential side effects.  He is agreeable to take.  Healthy diet and lifestyle reviewed. Recommended regular exercise 30 minutes at least 5 times a week.

## 2024-04-05 NOTE — Assessment & Plan Note (Signed)
 Lipid Panel     Component Value Date/Time   CHOL 190 06/28/2023 1523   TRIG 83 06/28/2023 1523   HDL 68 06/28/2023 1523   CHOLHDL 2.8 06/28/2023 1523   LDLCALC 107 (H) 06/28/2023 1523   LABVLDL 15 06/28/2023 1523   Suboptimal. Continue rosuvastatin , titrate up the dose to 10 mg once a day. Recheck lipid panel tentatively in 6 months prior to next follow-up visit with us  in the office. Target LDL less than 70 mg/dL.

## 2024-04-05 NOTE — Assessment & Plan Note (Signed)
 Improved since he discontinued Cialis . Advised him to keep himself well-hydrated. No further syncopal episodes. Reassured him. Encouraged regular exercise including strength training.

## 2024-05-24 ENCOUNTER — Ambulatory Visit

## 2024-05-24 ENCOUNTER — Encounter

## 2024-05-31 ENCOUNTER — Ambulatory Visit (HOSPITAL_BASED_OUTPATIENT_CLINIC_OR_DEPARTMENT_OTHER): Admitting: Student

## 2024-06-14 ENCOUNTER — Telehealth: Admitting: Behavioral Health
